# Patient Record
Sex: Female | Born: 1940
Health system: Southern US, Community
[De-identification: ages and names within clinical notes are randomized; demographics above are authoritative.]

## PROBLEM LIST (undated history)

## (undated) DIAGNOSIS — E7800 Pure hypercholesterolemia, unspecified: Secondary | ICD-10-CM

## (undated) DIAGNOSIS — F0390 Unspecified dementia without behavioral disturbance: Secondary | ICD-10-CM

## (undated) DIAGNOSIS — I639 Cerebral infarction, unspecified: Secondary | ICD-10-CM

## (undated) DIAGNOSIS — E119 Type 2 diabetes mellitus without complications: Secondary | ICD-10-CM

## (undated) DIAGNOSIS — I10 Essential (primary) hypertension: Secondary | ICD-10-CM

## (undated) DIAGNOSIS — H5440 Blindness, one eye, unspecified eye: Secondary | ICD-10-CM

## (undated) HISTORY — PX: ABDOMINAL HYSTERECTOMY: SHX81

## (undated) HISTORY — PX: COLON SURGERY: SHX602

## (undated) HISTORY — PX: CHOLECYSTECTOMY: SHX55

## (undated) HISTORY — PX: APPENDECTOMY: SHX54

---

## 2014-02-07 DIAGNOSIS — Z8679 Personal history of other diseases of the circulatory system: Secondary | ICD-10-CM | POA: Diagnosis not present

## 2014-02-07 DIAGNOSIS — R413 Other amnesia: Secondary | ICD-10-CM | POA: Diagnosis not present

## 2014-02-07 DIAGNOSIS — E119 Type 2 diabetes mellitus without complications: Secondary | ICD-10-CM | POA: Diagnosis not present

## 2014-02-07 DIAGNOSIS — R32 Unspecified urinary incontinence: Secondary | ICD-10-CM | POA: Diagnosis not present

## 2014-02-07 DIAGNOSIS — I10 Essential (primary) hypertension: Secondary | ICD-10-CM | POA: Diagnosis not present

## 2014-06-19 DIAGNOSIS — R413 Other amnesia: Secondary | ICD-10-CM | POA: Diagnosis not present

## 2014-06-19 DIAGNOSIS — I6350 Cerebral infarction due to unspecified occlusion or stenosis of unspecified cerebral artery: Secondary | ICD-10-CM | POA: Diagnosis not present

## 2014-09-20 DIAGNOSIS — Z79899 Other long term (current) drug therapy: Secondary | ICD-10-CM | POA: Diagnosis not present

## 2014-09-20 DIAGNOSIS — H269 Unspecified cataract: Secondary | ICD-10-CM | POA: Diagnosis not present

## 2014-09-20 DIAGNOSIS — E119 Type 2 diabetes mellitus without complications: Secondary | ICD-10-CM | POA: Diagnosis not present

## 2014-09-20 DIAGNOSIS — Z23 Encounter for immunization: Secondary | ICD-10-CM | POA: Diagnosis not present

## 2014-09-20 DIAGNOSIS — R32 Unspecified urinary incontinence: Secondary | ICD-10-CM | POA: Diagnosis not present

## 2014-09-20 DIAGNOSIS — I10 Essential (primary) hypertension: Secondary | ICD-10-CM | POA: Diagnosis not present

## 2014-09-20 DIAGNOSIS — B079 Viral wart, unspecified: Secondary | ICD-10-CM | POA: Diagnosis not present

## 2014-09-20 DIAGNOSIS — E559 Vitamin D deficiency, unspecified: Secondary | ICD-10-CM | POA: Diagnosis not present

## 2014-09-20 DIAGNOSIS — E785 Hyperlipidemia, unspecified: Secondary | ICD-10-CM | POA: Diagnosis not present

## 2015-02-21 DIAGNOSIS — I10 Essential (primary) hypertension: Secondary | ICD-10-CM | POA: Diagnosis not present

## 2015-02-21 DIAGNOSIS — M1990 Unspecified osteoarthritis, unspecified site: Secondary | ICD-10-CM | POA: Diagnosis not present

## 2015-02-21 DIAGNOSIS — R413 Other amnesia: Secondary | ICD-10-CM | POA: Diagnosis not present

## 2015-02-21 DIAGNOSIS — E119 Type 2 diabetes mellitus without complications: Secondary | ICD-10-CM | POA: Diagnosis not present

## 2015-05-02 DIAGNOSIS — R413 Other amnesia: Secondary | ICD-10-CM | POA: Diagnosis not present

## 2015-06-02 DIAGNOSIS — I10 Essential (primary) hypertension: Secondary | ICD-10-CM | POA: Diagnosis not present

## 2015-06-02 DIAGNOSIS — Z8673 Personal history of transient ischemic attack (TIA), and cerebral infarction without residual deficits: Secondary | ICD-10-CM | POA: Diagnosis not present

## 2015-06-02 DIAGNOSIS — E119 Type 2 diabetes mellitus without complications: Secondary | ICD-10-CM | POA: Diagnosis not present

## 2015-06-02 DIAGNOSIS — H6122 Impacted cerumen, left ear: Secondary | ICD-10-CM | POA: Diagnosis not present

## 2015-09-05 DIAGNOSIS — E0800 Diabetes mellitus due to underlying condition with hyperosmolarity without nonketotic hyperglycemic-hyperosmolar coma (NKHHC): Secondary | ICD-10-CM | POA: Diagnosis not present

## 2015-09-05 DIAGNOSIS — E782 Mixed hyperlipidemia: Secondary | ICD-10-CM | POA: Diagnosis not present

## 2015-09-05 DIAGNOSIS — I10 Essential (primary) hypertension: Secondary | ICD-10-CM | POA: Diagnosis not present

## 2015-09-05 DIAGNOSIS — G309 Alzheimer's disease, unspecified: Secondary | ICD-10-CM | POA: Diagnosis not present

## 2015-09-18 DIAGNOSIS — Z1231 Encounter for screening mammogram for malignant neoplasm of breast: Secondary | ICD-10-CM | POA: Diagnosis not present

## 2015-10-03 DIAGNOSIS — Z23 Encounter for immunization: Secondary | ICD-10-CM | POA: Diagnosis not present

## 2015-10-03 DIAGNOSIS — E782 Mixed hyperlipidemia: Secondary | ICD-10-CM | POA: Diagnosis not present

## 2015-10-03 DIAGNOSIS — E0800 Diabetes mellitus due to underlying condition with hyperosmolarity without nonketotic hyperglycemic-hyperosmolar coma (NKHHC): Secondary | ICD-10-CM | POA: Diagnosis not present

## 2015-10-03 DIAGNOSIS — I10 Essential (primary) hypertension: Secondary | ICD-10-CM | POA: Diagnosis not present

## 2016-02-06 DIAGNOSIS — E782 Mixed hyperlipidemia: Secondary | ICD-10-CM | POA: Diagnosis not present

## 2016-02-06 DIAGNOSIS — G309 Alzheimer's disease, unspecified: Secondary | ICD-10-CM | POA: Diagnosis not present

## 2016-02-06 DIAGNOSIS — E089 Diabetes mellitus due to underlying condition without complications: Secondary | ICD-10-CM | POA: Diagnosis not present

## 2016-02-06 DIAGNOSIS — I10 Essential (primary) hypertension: Secondary | ICD-10-CM | POA: Diagnosis not present

## 2016-03-16 DIAGNOSIS — M810 Age-related osteoporosis without current pathological fracture: Secondary | ICD-10-CM | POA: Diagnosis not present

## 2016-03-16 DIAGNOSIS — M8589 Other specified disorders of bone density and structure, multiple sites: Secondary | ICD-10-CM | POA: Diagnosis not present

## 2016-07-16 DIAGNOSIS — E089 Diabetes mellitus due to underlying condition without complications: Secondary | ICD-10-CM | POA: Diagnosis not present

## 2016-07-16 DIAGNOSIS — I10 Essential (primary) hypertension: Secondary | ICD-10-CM | POA: Diagnosis not present

## 2016-07-16 DIAGNOSIS — G309 Alzheimer's disease, unspecified: Secondary | ICD-10-CM | POA: Diagnosis not present

## 2016-07-16 DIAGNOSIS — E782 Mixed hyperlipidemia: Secondary | ICD-10-CM | POA: Diagnosis not present

## 2016-07-30 DIAGNOSIS — E119 Type 2 diabetes mellitus without complications: Secondary | ICD-10-CM | POA: Diagnosis not present

## 2016-07-30 DIAGNOSIS — H348112 Central retinal vein occlusion, right eye, stable: Secondary | ICD-10-CM | POA: Diagnosis not present

## 2016-07-30 DIAGNOSIS — H2513 Age-related nuclear cataract, bilateral: Secondary | ICD-10-CM | POA: Diagnosis not present

## 2016-07-30 DIAGNOSIS — H40013 Open angle with borderline findings, low risk, bilateral: Secondary | ICD-10-CM | POA: Diagnosis not present

## 2016-07-30 DIAGNOSIS — H43392 Other vitreous opacities, left eye: Secondary | ICD-10-CM | POA: Diagnosis not present

## 2016-08-04 ENCOUNTER — Encounter (INDEPENDENT_AMBULATORY_CARE_PROVIDER_SITE_OTHER): Payer: Self-pay | Admitting: Ophthalmology

## 2016-08-18 ENCOUNTER — Encounter (INDEPENDENT_AMBULATORY_CARE_PROVIDER_SITE_OTHER): Payer: Medicare Other | Admitting: Ophthalmology

## 2016-08-18 DIAGNOSIS — H2513 Age-related nuclear cataract, bilateral: Secondary | ICD-10-CM | POA: Diagnosis not present

## 2016-08-18 DIAGNOSIS — H34811 Central retinal vein occlusion, right eye, with macular edema: Secondary | ICD-10-CM

## 2016-08-18 DIAGNOSIS — H43813 Vitreous degeneration, bilateral: Secondary | ICD-10-CM | POA: Diagnosis not present

## 2016-08-18 DIAGNOSIS — H35033 Hypertensive retinopathy, bilateral: Secondary | ICD-10-CM | POA: Diagnosis not present

## 2016-08-18 DIAGNOSIS — I10 Essential (primary) hypertension: Secondary | ICD-10-CM | POA: Diagnosis not present

## 2016-10-23 DIAGNOSIS — G309 Alzheimer's disease, unspecified: Secondary | ICD-10-CM | POA: Diagnosis not present

## 2016-10-23 DIAGNOSIS — Z23 Encounter for immunization: Secondary | ICD-10-CM | POA: Diagnosis not present

## 2016-10-23 DIAGNOSIS — I10 Essential (primary) hypertension: Secondary | ICD-10-CM | POA: Diagnosis not present

## 2016-10-23 DIAGNOSIS — E782 Mixed hyperlipidemia: Secondary | ICD-10-CM | POA: Diagnosis not present

## 2016-10-27 DIAGNOSIS — Z9181 History of falling: Secondary | ICD-10-CM | POA: Diagnosis not present

## 2016-10-27 DIAGNOSIS — R261 Paralytic gait: Secondary | ICD-10-CM | POA: Diagnosis not present

## 2016-10-27 DIAGNOSIS — F0390 Unspecified dementia without behavioral disturbance: Secondary | ICD-10-CM | POA: Diagnosis not present

## 2016-10-27 DIAGNOSIS — I10 Essential (primary) hypertension: Secondary | ICD-10-CM | POA: Diagnosis not present

## 2016-10-27 DIAGNOSIS — F329 Major depressive disorder, single episode, unspecified: Secondary | ICD-10-CM | POA: Diagnosis not present

## 2016-10-27 DIAGNOSIS — E119 Type 2 diabetes mellitus without complications: Secondary | ICD-10-CM | POA: Diagnosis not present

## 2016-10-28 DIAGNOSIS — F0390 Unspecified dementia without behavioral disturbance: Secondary | ICD-10-CM | POA: Diagnosis not present

## 2016-10-28 DIAGNOSIS — F329 Major depressive disorder, single episode, unspecified: Secondary | ICD-10-CM | POA: Diagnosis not present

## 2016-10-28 DIAGNOSIS — E119 Type 2 diabetes mellitus without complications: Secondary | ICD-10-CM | POA: Diagnosis not present

## 2016-10-28 DIAGNOSIS — Z9181 History of falling: Secondary | ICD-10-CM | POA: Diagnosis not present

## 2016-10-28 DIAGNOSIS — R261 Paralytic gait: Secondary | ICD-10-CM | POA: Diagnosis not present

## 2016-10-28 DIAGNOSIS — I10 Essential (primary) hypertension: Secondary | ICD-10-CM | POA: Diagnosis not present

## 2016-11-02 DIAGNOSIS — F0390 Unspecified dementia without behavioral disturbance: Secondary | ICD-10-CM | POA: Diagnosis not present

## 2016-11-02 DIAGNOSIS — F329 Major depressive disorder, single episode, unspecified: Secondary | ICD-10-CM | POA: Diagnosis not present

## 2016-11-02 DIAGNOSIS — E119 Type 2 diabetes mellitus without complications: Secondary | ICD-10-CM | POA: Diagnosis not present

## 2016-11-02 DIAGNOSIS — Z9181 History of falling: Secondary | ICD-10-CM | POA: Diagnosis not present

## 2016-11-02 DIAGNOSIS — R261 Paralytic gait: Secondary | ICD-10-CM | POA: Diagnosis not present

## 2016-11-02 DIAGNOSIS — I10 Essential (primary) hypertension: Secondary | ICD-10-CM | POA: Diagnosis not present

## 2016-11-03 DIAGNOSIS — E119 Type 2 diabetes mellitus without complications: Secondary | ICD-10-CM | POA: Diagnosis not present

## 2016-11-03 DIAGNOSIS — I10 Essential (primary) hypertension: Secondary | ICD-10-CM | POA: Diagnosis not present

## 2016-11-03 DIAGNOSIS — Z9181 History of falling: Secondary | ICD-10-CM | POA: Diagnosis not present

## 2016-11-03 DIAGNOSIS — F0390 Unspecified dementia without behavioral disturbance: Secondary | ICD-10-CM | POA: Diagnosis not present

## 2016-11-03 DIAGNOSIS — F329 Major depressive disorder, single episode, unspecified: Secondary | ICD-10-CM | POA: Diagnosis not present

## 2016-11-03 DIAGNOSIS — R261 Paralytic gait: Secondary | ICD-10-CM | POA: Diagnosis not present

## 2016-11-05 DIAGNOSIS — R261 Paralytic gait: Secondary | ICD-10-CM | POA: Diagnosis not present

## 2016-11-05 DIAGNOSIS — F0390 Unspecified dementia without behavioral disturbance: Secondary | ICD-10-CM | POA: Diagnosis not present

## 2016-11-05 DIAGNOSIS — Z9181 History of falling: Secondary | ICD-10-CM | POA: Diagnosis not present

## 2016-11-05 DIAGNOSIS — F329 Major depressive disorder, single episode, unspecified: Secondary | ICD-10-CM | POA: Diagnosis not present

## 2016-11-05 DIAGNOSIS — I10 Essential (primary) hypertension: Secondary | ICD-10-CM | POA: Diagnosis not present

## 2016-11-05 DIAGNOSIS — E119 Type 2 diabetes mellitus without complications: Secondary | ICD-10-CM | POA: Diagnosis not present

## 2016-11-09 DIAGNOSIS — I10 Essential (primary) hypertension: Secondary | ICD-10-CM | POA: Diagnosis not present

## 2016-11-09 DIAGNOSIS — R261 Paralytic gait: Secondary | ICD-10-CM | POA: Diagnosis not present

## 2016-11-09 DIAGNOSIS — E119 Type 2 diabetes mellitus without complications: Secondary | ICD-10-CM | POA: Diagnosis not present

## 2016-11-09 DIAGNOSIS — Z9181 History of falling: Secondary | ICD-10-CM | POA: Diagnosis not present

## 2016-11-09 DIAGNOSIS — F0390 Unspecified dementia without behavioral disturbance: Secondary | ICD-10-CM | POA: Diagnosis not present

## 2016-11-09 DIAGNOSIS — F329 Major depressive disorder, single episode, unspecified: Secondary | ICD-10-CM | POA: Diagnosis not present

## 2016-11-10 DIAGNOSIS — F0390 Unspecified dementia without behavioral disturbance: Secondary | ICD-10-CM | POA: Diagnosis not present

## 2016-11-10 DIAGNOSIS — F329 Major depressive disorder, single episode, unspecified: Secondary | ICD-10-CM | POA: Diagnosis not present

## 2016-11-10 DIAGNOSIS — E119 Type 2 diabetes mellitus without complications: Secondary | ICD-10-CM | POA: Diagnosis not present

## 2016-11-10 DIAGNOSIS — R261 Paralytic gait: Secondary | ICD-10-CM | POA: Diagnosis not present

## 2016-11-10 DIAGNOSIS — I10 Essential (primary) hypertension: Secondary | ICD-10-CM | POA: Diagnosis not present

## 2016-11-10 DIAGNOSIS — Z9181 History of falling: Secondary | ICD-10-CM | POA: Diagnosis not present

## 2016-11-11 DIAGNOSIS — R261 Paralytic gait: Secondary | ICD-10-CM | POA: Diagnosis not present

## 2016-11-11 DIAGNOSIS — I10 Essential (primary) hypertension: Secondary | ICD-10-CM | POA: Diagnosis not present

## 2016-11-11 DIAGNOSIS — E119 Type 2 diabetes mellitus without complications: Secondary | ICD-10-CM | POA: Diagnosis not present

## 2016-11-11 DIAGNOSIS — F0390 Unspecified dementia without behavioral disturbance: Secondary | ICD-10-CM | POA: Diagnosis not present

## 2016-11-11 DIAGNOSIS — F329 Major depressive disorder, single episode, unspecified: Secondary | ICD-10-CM | POA: Diagnosis not present

## 2016-11-11 DIAGNOSIS — Z9181 History of falling: Secondary | ICD-10-CM | POA: Diagnosis not present

## 2016-11-12 DIAGNOSIS — I10 Essential (primary) hypertension: Secondary | ICD-10-CM | POA: Diagnosis not present

## 2016-11-12 DIAGNOSIS — Z9181 History of falling: Secondary | ICD-10-CM | POA: Diagnosis not present

## 2016-11-12 DIAGNOSIS — F0390 Unspecified dementia without behavioral disturbance: Secondary | ICD-10-CM | POA: Diagnosis not present

## 2016-11-12 DIAGNOSIS — R261 Paralytic gait: Secondary | ICD-10-CM | POA: Diagnosis not present

## 2016-11-12 DIAGNOSIS — E119 Type 2 diabetes mellitus without complications: Secondary | ICD-10-CM | POA: Diagnosis not present

## 2016-11-12 DIAGNOSIS — F329 Major depressive disorder, single episode, unspecified: Secondary | ICD-10-CM | POA: Diagnosis not present

## 2016-11-15 DIAGNOSIS — R261 Paralytic gait: Secondary | ICD-10-CM | POA: Diagnosis not present

## 2016-11-15 DIAGNOSIS — E119 Type 2 diabetes mellitus without complications: Secondary | ICD-10-CM | POA: Diagnosis not present

## 2016-11-15 DIAGNOSIS — Z9181 History of falling: Secondary | ICD-10-CM | POA: Diagnosis not present

## 2016-11-15 DIAGNOSIS — F0390 Unspecified dementia without behavioral disturbance: Secondary | ICD-10-CM | POA: Diagnosis not present

## 2016-11-15 DIAGNOSIS — I10 Essential (primary) hypertension: Secondary | ICD-10-CM | POA: Diagnosis not present

## 2016-11-15 DIAGNOSIS — F329 Major depressive disorder, single episode, unspecified: Secondary | ICD-10-CM | POA: Diagnosis not present

## 2016-11-16 DIAGNOSIS — I10 Essential (primary) hypertension: Secondary | ICD-10-CM | POA: Diagnosis not present

## 2016-11-16 DIAGNOSIS — R261 Paralytic gait: Secondary | ICD-10-CM | POA: Diagnosis not present

## 2016-11-16 DIAGNOSIS — F0390 Unspecified dementia without behavioral disturbance: Secondary | ICD-10-CM | POA: Diagnosis not present

## 2016-11-16 DIAGNOSIS — F329 Major depressive disorder, single episode, unspecified: Secondary | ICD-10-CM | POA: Diagnosis not present

## 2016-11-16 DIAGNOSIS — E119 Type 2 diabetes mellitus without complications: Secondary | ICD-10-CM | POA: Diagnosis not present

## 2016-11-16 DIAGNOSIS — Z9181 History of falling: Secondary | ICD-10-CM | POA: Diagnosis not present

## 2016-11-19 DIAGNOSIS — E119 Type 2 diabetes mellitus without complications: Secondary | ICD-10-CM | POA: Diagnosis not present

## 2016-11-19 DIAGNOSIS — F0390 Unspecified dementia without behavioral disturbance: Secondary | ICD-10-CM | POA: Diagnosis not present

## 2016-11-19 DIAGNOSIS — R261 Paralytic gait: Secondary | ICD-10-CM | POA: Diagnosis not present

## 2016-11-19 DIAGNOSIS — I10 Essential (primary) hypertension: Secondary | ICD-10-CM | POA: Diagnosis not present

## 2016-11-19 DIAGNOSIS — Z9181 History of falling: Secondary | ICD-10-CM | POA: Diagnosis not present

## 2016-11-19 DIAGNOSIS — F329 Major depressive disorder, single episode, unspecified: Secondary | ICD-10-CM | POA: Diagnosis not present

## 2016-11-24 DIAGNOSIS — E119 Type 2 diabetes mellitus without complications: Secondary | ICD-10-CM | POA: Diagnosis not present

## 2016-11-24 DIAGNOSIS — F0390 Unspecified dementia without behavioral disturbance: Secondary | ICD-10-CM | POA: Diagnosis not present

## 2016-11-24 DIAGNOSIS — I10 Essential (primary) hypertension: Secondary | ICD-10-CM | POA: Diagnosis not present

## 2016-11-24 DIAGNOSIS — Z9181 History of falling: Secondary | ICD-10-CM | POA: Diagnosis not present

## 2016-11-24 DIAGNOSIS — F329 Major depressive disorder, single episode, unspecified: Secondary | ICD-10-CM | POA: Diagnosis not present

## 2016-11-24 DIAGNOSIS — R261 Paralytic gait: Secondary | ICD-10-CM | POA: Diagnosis not present

## 2016-11-25 DIAGNOSIS — Z9181 History of falling: Secondary | ICD-10-CM | POA: Diagnosis not present

## 2016-11-25 DIAGNOSIS — F329 Major depressive disorder, single episode, unspecified: Secondary | ICD-10-CM | POA: Diagnosis not present

## 2016-11-25 DIAGNOSIS — R261 Paralytic gait: Secondary | ICD-10-CM | POA: Diagnosis not present

## 2016-11-25 DIAGNOSIS — E119 Type 2 diabetes mellitus without complications: Secondary | ICD-10-CM | POA: Diagnosis not present

## 2016-11-25 DIAGNOSIS — I10 Essential (primary) hypertension: Secondary | ICD-10-CM | POA: Diagnosis not present

## 2016-11-25 DIAGNOSIS — F0390 Unspecified dementia without behavioral disturbance: Secondary | ICD-10-CM | POA: Diagnosis not present

## 2016-11-26 DIAGNOSIS — Z9181 History of falling: Secondary | ICD-10-CM | POA: Diagnosis not present

## 2016-11-26 DIAGNOSIS — F329 Major depressive disorder, single episode, unspecified: Secondary | ICD-10-CM | POA: Diagnosis not present

## 2016-11-26 DIAGNOSIS — R261 Paralytic gait: Secondary | ICD-10-CM | POA: Diagnosis not present

## 2016-11-26 DIAGNOSIS — F0390 Unspecified dementia without behavioral disturbance: Secondary | ICD-10-CM | POA: Diagnosis not present

## 2016-11-26 DIAGNOSIS — E119 Type 2 diabetes mellitus without complications: Secondary | ICD-10-CM | POA: Diagnosis not present

## 2016-11-26 DIAGNOSIS — I10 Essential (primary) hypertension: Secondary | ICD-10-CM | POA: Diagnosis not present

## 2016-11-29 DIAGNOSIS — Z9181 History of falling: Secondary | ICD-10-CM | POA: Diagnosis not present

## 2016-11-29 DIAGNOSIS — E119 Type 2 diabetes mellitus without complications: Secondary | ICD-10-CM | POA: Diagnosis not present

## 2016-11-29 DIAGNOSIS — F0390 Unspecified dementia without behavioral disturbance: Secondary | ICD-10-CM | POA: Diagnosis not present

## 2016-11-29 DIAGNOSIS — R261 Paralytic gait: Secondary | ICD-10-CM | POA: Diagnosis not present

## 2016-11-29 DIAGNOSIS — I10 Essential (primary) hypertension: Secondary | ICD-10-CM | POA: Diagnosis not present

## 2016-11-29 DIAGNOSIS — F329 Major depressive disorder, single episode, unspecified: Secondary | ICD-10-CM | POA: Diagnosis not present

## 2016-11-30 DIAGNOSIS — Z9181 History of falling: Secondary | ICD-10-CM | POA: Diagnosis not present

## 2016-11-30 DIAGNOSIS — I10 Essential (primary) hypertension: Secondary | ICD-10-CM | POA: Diagnosis not present

## 2016-11-30 DIAGNOSIS — F0390 Unspecified dementia without behavioral disturbance: Secondary | ICD-10-CM | POA: Diagnosis not present

## 2016-11-30 DIAGNOSIS — F329 Major depressive disorder, single episode, unspecified: Secondary | ICD-10-CM | POA: Diagnosis not present

## 2016-11-30 DIAGNOSIS — E119 Type 2 diabetes mellitus without complications: Secondary | ICD-10-CM | POA: Diagnosis not present

## 2016-11-30 DIAGNOSIS — R261 Paralytic gait: Secondary | ICD-10-CM | POA: Diagnosis not present

## 2016-12-13 ENCOUNTER — Ambulatory Visit (INDEPENDENT_AMBULATORY_CARE_PROVIDER_SITE_OTHER): Payer: Medicare Other | Admitting: Ophthalmology

## 2017-02-26 DIAGNOSIS — I10 Essential (primary) hypertension: Secondary | ICD-10-CM | POA: Diagnosis not present

## 2017-02-26 DIAGNOSIS — E782 Mixed hyperlipidemia: Secondary | ICD-10-CM | POA: Diagnosis not present

## 2017-02-26 DIAGNOSIS — G309 Alzheimer's disease, unspecified: Secondary | ICD-10-CM | POA: Diagnosis not present

## 2017-02-26 DIAGNOSIS — E0800 Diabetes mellitus due to underlying condition with hyperosmolarity without nonketotic hyperglycemic-hyperosmolar coma (NKHHC): Secondary | ICD-10-CM | POA: Diagnosis not present

## 2017-08-20 DIAGNOSIS — E782 Mixed hyperlipidemia: Secondary | ICD-10-CM | POA: Diagnosis not present

## 2017-08-20 DIAGNOSIS — I10 Essential (primary) hypertension: Secondary | ICD-10-CM | POA: Diagnosis not present

## 2017-08-20 DIAGNOSIS — E0800 Diabetes mellitus due to underlying condition with hyperosmolarity without nonketotic hyperglycemic-hyperosmolar coma (NKHHC): Secondary | ICD-10-CM | POA: Diagnosis not present

## 2017-08-20 DIAGNOSIS — H5410 Blindness, one eye, low vision other eye, unspecified eyes: Secondary | ICD-10-CM | POA: Diagnosis not present

## 2017-08-20 DIAGNOSIS — G309 Alzheimer's disease, unspecified: Secondary | ICD-10-CM | POA: Diagnosis not present

## 2018-01-21 DIAGNOSIS — E782 Mixed hyperlipidemia: Secondary | ICD-10-CM | POA: Diagnosis not present

## 2018-01-21 DIAGNOSIS — H5410 Blindness, one eye, low vision other eye, unspecified eyes: Secondary | ICD-10-CM | POA: Diagnosis not present

## 2018-01-21 DIAGNOSIS — I10 Essential (primary) hypertension: Secondary | ICD-10-CM | POA: Diagnosis not present

## 2018-01-21 DIAGNOSIS — E0800 Diabetes mellitus due to underlying condition with hyperosmolarity without nonketotic hyperglycemic-hyperosmolar coma (NKHHC): Secondary | ICD-10-CM | POA: Diagnosis not present

## 2018-01-21 DIAGNOSIS — G309 Alzheimer's disease, unspecified: Secondary | ICD-10-CM | POA: Diagnosis not present

## 2018-04-06 ENCOUNTER — Other Ambulatory Visit: Payer: Self-pay

## 2018-04-06 ENCOUNTER — Emergency Department (HOSPITAL_COMMUNITY)
Admission: EM | Admit: 2018-04-06 | Discharge: 2018-04-06 | Disposition: A | Discharge status: Home / Self Care | Payer: Medicare Other | Attending: Emergency Medicine | Admitting: Emergency Medicine

## 2018-04-06 ENCOUNTER — Encounter (HOSPITAL_COMMUNITY): Payer: Self-pay | Admitting: Emergency Medicine

## 2018-04-06 ENCOUNTER — Emergency Department (HOSPITAL_COMMUNITY): Payer: Medicare Other

## 2018-04-06 DIAGNOSIS — Z7984 Long term (current) use of oral hypoglycemic drugs: Secondary | ICD-10-CM | POA: Insufficient documentation

## 2018-04-06 DIAGNOSIS — Y92009 Unspecified place in unspecified non-institutional (private) residence as the place of occurrence of the external cause: Secondary | ICD-10-CM | POA: Diagnosis not present

## 2018-04-06 DIAGNOSIS — F0390 Unspecified dementia without behavioral disturbance: Secondary | ICD-10-CM | POA: Diagnosis not present

## 2018-04-06 DIAGNOSIS — W01198A Fall on same level from slipping, tripping and stumbling with subsequent striking against other object, initial encounter: Secondary | ICD-10-CM | POA: Insufficient documentation

## 2018-04-06 DIAGNOSIS — E119 Type 2 diabetes mellitus without complications: Secondary | ICD-10-CM | POA: Insufficient documentation

## 2018-04-06 DIAGNOSIS — I10 Essential (primary) hypertension: Secondary | ICD-10-CM | POA: Diagnosis not present

## 2018-04-06 DIAGNOSIS — W19XXXA Unspecified fall, initial encounter: Secondary | ICD-10-CM

## 2018-04-06 DIAGNOSIS — S0101XA Laceration without foreign body of scalp, initial encounter: Secondary | ICD-10-CM | POA: Insufficient documentation

## 2018-04-06 DIAGNOSIS — Y93E8 Activity, other personal hygiene: Secondary | ICD-10-CM | POA: Diagnosis not present

## 2018-04-06 DIAGNOSIS — Z79899 Other long term (current) drug therapy: Secondary | ICD-10-CM | POA: Diagnosis not present

## 2018-04-06 DIAGNOSIS — S199XXA Unspecified injury of neck, initial encounter: Secondary | ICD-10-CM | POA: Diagnosis not present

## 2018-04-06 DIAGNOSIS — Z7982 Long term (current) use of aspirin: Secondary | ICD-10-CM | POA: Insufficient documentation

## 2018-04-06 DIAGNOSIS — S0990XA Unspecified injury of head, initial encounter: Secondary | ICD-10-CM | POA: Diagnosis not present

## 2018-04-06 DIAGNOSIS — S0003XA Contusion of scalp, initial encounter: Secondary | ICD-10-CM | POA: Diagnosis not present

## 2018-04-06 DIAGNOSIS — S064X0A Epidural hemorrhage without loss of consciousness, initial encounter: Secondary | ICD-10-CM | POA: Diagnosis not present

## 2018-04-06 DIAGNOSIS — Y999 Unspecified external cause status: Secondary | ICD-10-CM | POA: Diagnosis not present

## 2018-04-06 HISTORY — DX: Pure hypercholesterolemia, unspecified: E78.00

## 2018-04-06 HISTORY — DX: Blindness, one eye, unspecified eye: H54.40

## 2018-04-06 HISTORY — DX: Cerebral infarction, unspecified: I63.9

## 2018-04-06 HISTORY — DX: Essential (primary) hypertension: I10

## 2018-04-06 HISTORY — DX: Type 2 diabetes mellitus without complications: E11.9

## 2018-04-06 HISTORY — DX: Unspecified dementia, unspecified severity, without behavioral disturbance, psychotic disturbance, mood disturbance, and anxiety: F03.90

## 2018-04-06 MED ORDER — LIDOCAINE-EPINEPHRINE 2 %-1:200000 IJ SOLN
10.0000 mL | Freq: Once | INTRAMUSCULAR | Status: AC
Start: 2018-04-06 — End: 2018-04-06
  Administered 2018-04-06: 10 mL
  Filled 2018-04-06: qty 20

## 2018-04-06 NOTE — Care Management Note (Signed)
Case Management Note  CM was contacted by Denyse Amassorey with [facility] reporting pt was active with their PCS services and would be happy to start [facility].  Spoke with Dr. Fayrene FearingJames who agreed with plan.  Dr. Fayrene FearingJames did not remember to place orders within the allotted amount of time after pt wa D/C.  Updated Denyse AmassCorey that he will need to contact pt's PCP to start [facility] PT.  No further CM needs noted at this time.

## 2018-04-06 NOTE — ED Triage Notes (Signed)
Pt arrives via EMS. Per her daughter pt fell backwards from standing when trying to get off of the bedside commode. She hit her head on the floor, no LOC.

## 2018-04-06 NOTE — Discharge Instructions (Addendum)
Staple removal in 7-10 days. °

## 2018-04-06 NOTE — ED Notes (Signed)
Bed: WA21 Expected date:  Expected time:  Means of arrival:  Comments: 77 yo fall

## 2018-04-06 NOTE — ED Provider Notes (Signed)
[facility]-EMERGENCY DEPT Provider Note   CSN: [id number] Arrival date & time: 04/06/18  0813     History   Chief Complaint Chief Complaint  Patient presents with  . Fall    HPI Sydney Horton is a 77 y.o. female.  Chief complaint is fall  HPI 77 year old female.  Accompanied by her daughter.  Patient lives at home with her daughter.  History of dementia.  She was standing from her bedside commode this morning.  This was with assist.  However, she lost her balance and fell backwards and struck her head against a piece of furniture and has a laceration to the left occiput.  Daughter she was not unconscious.  She is mentating at her baseline.  Ambulatory.  No difficulty with use of arms or legs or level of consciousness.  Coagulated only with a baby aspirin daily  Past Medical History:  Diagnosis Date  . Blind right eye   . Dementia   . Diabetes mellitus without complication (HCC)   . High cholesterol   . Hypertension   . Stroke [facility])     There are no active problems to display for this patient.   Past Surgical History:  Procedure Laterality Date  . ABDOMINAL HYSTERECTOMY    . APPENDECTOMY    . CHOLECYSTECTOMY    . COLON SURGERY       OB History   None      Home Medications    Prior to Admission medications   Medication Sig Start Date End Date Taking? Authorizing Provider  aspirin EC 81 MG tablet Take 81 mg by mouth daily.   Yes [provider]  benazepril (LOTENSIN) 10 MG tablet Take 10 mg by mouth daily. 03/23/18  Yes [provider]  memantine (NAMENDA XR) 28 MG CP24 24 hr capsule Take 28 mg by mouth daily. 03/19/18  Yes [provider]  metFORMIN (GLUCOPHAGE) 1000 MG tablet Take 1,000 mg by mouth 2 (two) times daily. 03/19/18  Yes [provider]  MYRBETRIQ 25 MG TB24 tablet Take 25 mg by mouth daily. 03/20/18  Yes [provider]  rivastigmine (EXELON) 13.3 MG/24HR Place 1 patch onto the  skin daily. 03/19/18  Yes [provider]  sertraline (ZOLOFT) 25 MG tablet Take 25 mg by mouth daily. 03/23/18  Yes [provider]    Family History No family history on file.  Social History Social History   Tobacco Use  . Smoking status: Never Smoker  . Smokeless tobacco: Never Used  Substance Use Topics  . Alcohol use: Never    Frequency: Never  . Drug use: Never     Allergies   Patient has no known allergies.   Review of Systems Review of Systems  Constitutional: Negative for appetite change, chills, diaphoresis, fatigue and fever.  HENT: Negative for mouth sores, sore throat and trouble swallowing.        Scalp laceration  Eyes: Negative for visual disturbance.  Respiratory: Negative for cough, chest tightness, shortness of breath and wheezing.   Cardiovascular: Negative for chest pain.  Gastrointestinal: Negative for abdominal distention, abdominal pain, diarrhea, nausea and vomiting.  Endocrine: Negative for polydipsia, polyphagia and polyuria.  Genitourinary: Negative for dysuria, frequency and hematuria.  Musculoskeletal: Negative for gait problem.  Skin: Positive for wound. Negative for color change, pallor and rash.  Neurological: Negative for dizziness, syncope, light-headedness and headaches.  Hematological: Does not bruise/bleed easily.  Psychiatric/Behavioral: Negative for behavioral problems and confusion.  Physical Exam Updated Vital Signs BP (!) 164/91   Pulse 90   Temp 99 F (37.2 C) (Oral)   Resp 18   Ht 5' (1.524 m)   Wt 61.2 kg (135 lb)   SpO2 96%   BMI 26.37 kg/m   Physical Exam  Constitutional: She appears well-developed and well-nourished. No distress.  HENT:  Head: Normocephalic.  3 cm horizontally oriented occipital scalp laceration just to the left of midline.  No blood over TMs, mastoids, or from ears nose or mouth.  She is in a cervical collar.  Denies any neck pain.  Eyes: Pupils are equal, round, and  reactive to light. Conjunctivae are normal. No scleral icterus.  Neck: Normal range of motion. Neck supple. No thyromegaly present.  Cardiovascular: Normal rate and regular rhythm. Exam reveals no gallop and no friction rub.  No murmur heard. Pulmonary/Chest: Effort normal and breath sounds normal. No respiratory distress. She has no wheezes. She has no rales.  Abdominal: Soft. Bowel sounds are normal. She exhibits no distension. There is no tenderness. There is no rebound.  Musculoskeletal: Normal range of motion.  Neurological: She is alert.  Awake.  Smiling and interactive.  Dementia evident.  No focal neurological deficits.  Skin: Skin is warm and dry. No rash noted.  Psychiatric: She has a normal mood and affect. Her behavior is normal.     LACERATION REPAIR Performed by: Claudean KindsMark Joseph Kess Mcilwain Authorized by: Claudean KindsMark Joseph Osha Errico Consent: Verbal consent obtained. Risks and benefits: risks, benefits and alternatives were discussed Consent given by: patient Patient identity confirmed: provided demographic data Prepped and Draped in normal sterile fashion Wound explored  Laceration Location: Lt occiput  Laceration Length: 3cm  No Foreign Bodies seen or palpated  Anesthesia: local infiltration  Local anesthetic: lidocaine 1% c epinephrine  Anesthetic total: 4 ml  Irrigation method: syringe Amount of cleaning: standard  Skin closure: 3 staples  Number of sutures: 3 staples  Technique: staples  Patient tolerance: Patient tolerated the procedure well with no immediate complications.   Patient CT of her neck shows no acute findings or fracture.  CT of head shows thickening of the posterior falx and right tentorium.  Differential diagnosis includes simple dural thickening versus trauma.  This was called to me by radiologist.  I have reviewed this myself.  This is a subtle finding.  I discussed the case with Dr. Lovell SheehanJenkins of neurosurgery.  He felt that this was not clinically  significant.  We discussed the patient's baseline health status.  He felt that if they were this were a large subdural he would not surgically intervene due to patient's advanced age and dementia.  I discussed this at length with daughter.  This is consistent with her wishes.  She would not want acute surgical intervention.  We discussed patient going home with daughter.  Hold aspirin.  Recheck any marked change in her behavior or mental status.  She was made aware of the calcifications as well with a similar plan of being aware of these but not wishing any additional evaluation for possibility of subtle abnormality, malignancy, bleeding, or advanced imaging.   ED Treatments / Results  Labs (all labs ordered are listed, but only abnormal results are displayed) Labs Reviewed - No data to display  EKG None  Radiology Ct Head Wo Contrast  Result Date: 04/06/2018 CLINICAL DATA:  Cervical spine trauma.  Fall with head injury. EXAM: CT HEAD WITHOUT CONTRAST CT CERVICAL SPINE WITHOUT CONTRAST TECHNIQUE: Multidetector CT imaging of  the head and cervical spine was performed following the standard protocol without intravenous contrast. Multiplanar CT image reconstructions of the cervical spine were also generated. COMPARISON:  None. FINDINGS: CT HEAD FINDINGS Brain: Mild dural thickening seen along the anterior and posterior falx and right more than left tentorium. In the setting of trauma this is presumably minimal subdural hematoma, up to 2 mm in thickness and without mass effect. No evidence of subarachnoid hemorrhage or contusion. There is nodular dystrophic calcification in the suprasellar cistern, contacting but not clearly arising from the dilated and elongated basilar which measures 7 mm in diameter. Calcification measures up to 14 mm. Calcifying mass in this location is often related to craniopharyngioma, but no neighboring noncalcified mass is seen. Additionally, there is a smaller focus of dystrophic  appearing calcification in the lower right sylvian fissure. Calcifying dermoid/epidermoid spectrum or remote basal meningitis are considerations. No dural contact for meningioma. Reportedly the patient has significant dementia. If appropriate for comorbidities a pituitary protocol brain MRI might narrow the differential. Ventriculomegaly which is likely from central predominant atrophy. Smoothly contoured low densities in the right putamen favoring dilated perivascular spaces given the rounded shape. Small remote left cerebellar infarct. Vascular: Dilated distal basilar which is also atherosclerotic, 7 mm in diameter. Skull: Contusion along the left frontal and parietal scalp. Sinuses/Orbits: No evidence of injury CT CERVICAL SPINE FINDINGS Alignment: No traumatic malalignment. Skull base and vertebrae: Negative for fracture Soft tissues and spinal canal: No prevertebral fluid or swelling. No visible canal hematoma. Disc levels: Diffuse degenerative disc narrowing, endplate irregularity, and spurring. Upper chest: Negative IMPRESSION: 1. Intermittent mild dural thickening seen along the falx and tentorium compatible with trace subdural hematoma in this setting. Thickness only measures up to 2 mm and there is no mass effect. 2. Unusual dystrophic calcification at the suprasellar cistern and in the lower right sylvian fissure. Please see further discussion above. 3. Ectatic distal basilar measuring up to 7 mm in diameter. 4. No evidence of cervical spine fracture. Electronically Signed   By: Marnee Spring M.D.   On: 04/06/2018 11:08   Ct Cervical Spine Wo Contrast  Result Date: 04/06/2018 CLINICAL DATA:  Cervical spine trauma.  Fall with head injury. EXAM: CT HEAD WITHOUT CONTRAST CT CERVICAL SPINE WITHOUT CONTRAST TECHNIQUE: Multidetector CT imaging of the head and cervical spine was performed following the standard protocol without intravenous contrast. Multiplanar CT image reconstructions of the cervical  spine were also generated. COMPARISON:  None. FINDINGS: CT HEAD FINDINGS Brain: Mild dural thickening seen along the anterior and posterior falx and right more than left tentorium. In the setting of trauma this is presumably minimal subdural hematoma, up to 2 mm in thickness and without mass effect. No evidence of subarachnoid hemorrhage or contusion. There is nodular dystrophic calcification in the suprasellar cistern, contacting but not clearly arising from the dilated and elongated basilar which measures 7 mm in diameter. Calcification measures up to 14 mm. Calcifying mass in this location is often related to craniopharyngioma, but no neighboring noncalcified mass is seen. Additionally, there is a smaller focus of dystrophic appearing calcification in the lower right sylvian fissure. Calcifying dermoid/epidermoid spectrum or remote basal meningitis are considerations. No dural contact for meningioma. Reportedly the patient has significant dementia. If appropriate for comorbidities a pituitary protocol brain MRI might narrow the differential. Ventriculomegaly which is likely from central predominant atrophy. Smoothly contoured low densities in the right putamen favoring dilated perivascular spaces given the rounded shape. Small remote left cerebellar infarct.  Vascular: Dilated distal basilar which is also atherosclerotic, 7 mm in diameter. Skull: Contusion along the left frontal and parietal scalp. Sinuses/Orbits: No evidence of injury CT CERVICAL SPINE FINDINGS Alignment: No traumatic malalignment. Skull base and vertebrae: Negative for fracture Soft tissues and spinal canal: No prevertebral fluid or swelling. No visible canal hematoma. Disc levels: Diffuse degenerative disc narrowing, endplate irregularity, and spurring. Upper chest: Negative IMPRESSION: 1. Intermittent mild dural thickening seen along the falx and tentorium compatible with trace subdural hematoma in this setting. Thickness only measures up to 2  mm and there is no mass effect. 2. Unusual dystrophic calcification at the suprasellar cistern and in the lower right sylvian fissure. Please see further discussion above. 3. Ectatic distal basilar measuring up to 7 mm in diameter. 4. No evidence of cervical spine fracture. Electronically Signed   By: Marnee Spring M.D.   On: 04/06/2018 11:08    Procedures Procedures (including critical care time)  Medications Ordered in ED Medications  lidocaine-EPINEPHrine (XYLOCAINE W/EPI) 2 %-1:200000 (PF) injection 10 mL (10 mLs Infiltration Given 04/06/18 0913)     Initial Impression / Assessment and Plan / ED Course  I have reviewed the triage vital signs and the nursing notes.  Pertinent labs & imaging results that were available during my care of the patient were reviewed by me and considered in my medical decision making (see chart for details).      Final Clinical Impressions(s) / ED Diagnoses   Final diagnoses:  Laceration of scalp, initial encounter  Fall, initial encounter    ED Discharge Orders    None       Rolland Porter, MD 04/06/18 1155

## 2018-04-08 DIAGNOSIS — S0101XD Laceration without foreign body of scalp, subsequent encounter: Secondary | ICD-10-CM | POA: Diagnosis not present

## 2018-04-08 DIAGNOSIS — I10 Essential (primary) hypertension: Secondary | ICD-10-CM | POA: Diagnosis not present

## 2018-04-11 DIAGNOSIS — I10 Essential (primary) hypertension: Secondary | ICD-10-CM | POA: Diagnosis not present

## 2018-04-11 DIAGNOSIS — S0101XD Laceration without foreign body of scalp, subsequent encounter: Secondary | ICD-10-CM | POA: Diagnosis not present

## 2018-04-18 DIAGNOSIS — S0101XD Laceration without foreign body of scalp, subsequent encounter: Secondary | ICD-10-CM | POA: Diagnosis not present

## 2018-04-18 DIAGNOSIS — I10 Essential (primary) hypertension: Secondary | ICD-10-CM | POA: Diagnosis not present

## 2018-04-24 DIAGNOSIS — I10 Essential (primary) hypertension: Secondary | ICD-10-CM | POA: Diagnosis not present

## 2018-04-24 DIAGNOSIS — S0101XD Laceration without foreign body of scalp, subsequent encounter: Secondary | ICD-10-CM | POA: Diagnosis not present

## 2018-10-30 ENCOUNTER — Other Ambulatory Visit: Payer: Self-pay | Admitting: Nurse Practitioner

## 2018-10-30 ENCOUNTER — Ambulatory Visit
Admission: RE | Admit: 2018-10-30 | Discharge: 2018-10-30 | Disposition: A | Discharge status: Home / Self Care | Payer: Medicare Other | Source: Ambulatory Visit | Attending: Nurse Practitioner | Admitting: Nurse Practitioner

## 2018-10-30 DIAGNOSIS — M25561 Pain in right knee: Secondary | ICD-10-CM

## 2018-11-15 ENCOUNTER — Other Ambulatory Visit: Payer: Self-pay | Admitting: Family Medicine

## 2018-11-15 DIAGNOSIS — M1990 Unspecified osteoarthritis, unspecified site: Secondary | ICD-10-CM

## 2019-02-19 ENCOUNTER — Ambulatory Visit (HOSPITAL_COMMUNITY)
Admission: RE | Admit: 2019-02-19 | Discharge: 2019-02-19 | Disposition: A | Discharge status: Home / Self Care | Payer: Medicare (Managed Care) | Source: Ambulatory Visit | Attending: Nurse Practitioner | Admitting: Nurse Practitioner

## 2019-02-19 ENCOUNTER — Other Ambulatory Visit (HOSPITAL_COMMUNITY): Payer: Self-pay | Admitting: Nurse Practitioner

## 2019-02-19 DIAGNOSIS — M25561 Pain in right knee: Secondary | ICD-10-CM | POA: Diagnosis present

## 2019-02-23 ENCOUNTER — Other Ambulatory Visit: Payer: Self-pay | Admitting: Nurse Practitioner

## 2019-02-23 ENCOUNTER — Ambulatory Visit
Admission: RE | Admit: 2019-02-23 | Discharge: 2019-02-23 | Disposition: A | Discharge status: Home / Self Care | Payer: Medicare (Managed Care) | Source: Ambulatory Visit | Attending: Nurse Practitioner | Admitting: Nurse Practitioner

## 2019-02-23 DIAGNOSIS — R52 Pain, unspecified: Secondary | ICD-10-CM

## 2021-06-22 ENCOUNTER — Other Ambulatory Visit: Payer: Self-pay

## 2021-06-22 ENCOUNTER — Emergency Department (HOSPITAL_COMMUNITY): Payer: Medicare (Managed Care)

## 2021-06-22 ENCOUNTER — Emergency Department (HOSPITAL_COMMUNITY)
Admission: EM | Admit: 2021-06-22 | Discharge: 2021-06-23 | Disposition: A | Discharge status: Home / Self Care | Payer: Medicare (Managed Care) | Attending: Emergency Medicine | Admitting: Emergency Medicine

## 2021-06-22 ENCOUNTER — Encounter (HOSPITAL_COMMUNITY): Payer: Self-pay

## 2021-06-22 DIAGNOSIS — M25572 Pain in left ankle and joints of left foot: Secondary | ICD-10-CM | POA: Diagnosis not present

## 2021-06-22 DIAGNOSIS — E119 Type 2 diabetes mellitus without complications: Secondary | ICD-10-CM | POA: Diagnosis not present

## 2021-06-22 DIAGNOSIS — Z7984 Long term (current) use of oral hypoglycemic drugs: Secondary | ICD-10-CM | POA: Insufficient documentation

## 2021-06-22 DIAGNOSIS — M25562 Pain in left knee: Secondary | ICD-10-CM | POA: Insufficient documentation

## 2021-06-22 DIAGNOSIS — M25552 Pain in left hip: Secondary | ICD-10-CM | POA: Diagnosis present

## 2021-06-22 DIAGNOSIS — Y9389 Activity, other specified: Secondary | ICD-10-CM | POA: Diagnosis not present

## 2021-06-22 DIAGNOSIS — W010XXA Fall on same level from slipping, tripping and stumbling without subsequent striking against object, initial encounter: Secondary | ICD-10-CM | POA: Insufficient documentation

## 2021-06-22 DIAGNOSIS — Y92009 Unspecified place in unspecified non-institutional (private) residence as the place of occurrence of the external cause: Secondary | ICD-10-CM | POA: Diagnosis not present

## 2021-06-22 DIAGNOSIS — S82892A Other fracture of left lower leg, initial encounter for closed fracture: Secondary | ICD-10-CM

## 2021-06-22 DIAGNOSIS — W19XXXA Unspecified fall, initial encounter: Secondary | ICD-10-CM

## 2021-06-22 DIAGNOSIS — Z7982 Long term (current) use of aspirin: Secondary | ICD-10-CM | POA: Insufficient documentation

## 2021-06-22 DIAGNOSIS — I10 Essential (primary) hypertension: Secondary | ICD-10-CM | POA: Insufficient documentation

## 2021-06-22 DIAGNOSIS — Z79899 Other long term (current) drug therapy: Secondary | ICD-10-CM | POA: Insufficient documentation

## 2021-06-22 DIAGNOSIS — F0390 Unspecified dementia without behavioral disturbance: Secondary | ICD-10-CM | POA: Insufficient documentation

## 2021-06-22 MED ORDER — ACETAMINOPHEN 325 MG PO TABS
650.0000 mg | ORAL_TABLET | Freq: Once | ORAL | Status: AC
Start: 2021-06-22 — End: 2021-06-23
  Administered 2021-06-23: 650 mg via ORAL
  Filled 2021-06-22: qty 2

## 2021-06-22 NOTE — ED Provider Notes (Signed)
[facility]-EMERGENCY DEPT Provider Note   CSN: [id number] Arrival date & time: 06/22/21  2103     History Chief Complaint  Patient presents with   Sydney Horton is a 80 y.o. female.  The history is provided by the patient and a relative.  Fall Sydney Horton is a 80 y.o. female who presents to the Emergency Department complaining of fall.  Level V caveat due to dementia.  Hx is provided by the patient's daughter. She presents the emergency department by EMS for evaluation following a fall at home. She was on the bedside commode when she was reaching over to grab it depends when she slipped and fell, landing on the floor. No head injury or loss of consciousness. She did land on her left leg. She complained of pain to her left hip at home. In the emergency department she complains of pain to the knee and ankle. She baseline ambulates with a walker. She does not take any blood thinners. No recent illnesses.     Past Medical History:  Diagnosis Date   Blind right eye    Dementia (HCC)    Diabetes mellitus without complication (HCC)    High cholesterol    Hypertension    Stroke (HCC)     There are no problems to display for this patient.   Past Surgical History:  Procedure Laterality Date   ABDOMINAL HYSTERECTOMY     APPENDECTOMY     CHOLECYSTECTOMY     COLON SURGERY       OB History   No obstetric history on file.     History reviewed. No pertinent family history.  Social History   Tobacco Use   Smoking status: Never   Smokeless tobacco: Never  Substance Use Topics   Alcohol use: Never   Drug use: Never    Home Medications Prior to Admission medications   Medication Sig Start Date End Date Taking? Authorizing Provider  aspirin EC 81 MG tablet Take 81 mg by mouth daily.    [provider]  benazepril (LOTENSIN) 10 MG tablet Take 10 mg by mouth daily. 03/23/18   [provider]  memantine (NAMENDA XR) 28 MG  CP24 24 hr capsule Take 28 mg by mouth daily. 03/19/18   [provider]  metFORMIN (GLUCOPHAGE) 1000 MG tablet Take 1,000 mg by mouth 2 (two) times daily. 03/19/18   [provider]  MYRBETRIQ 25 MG TB24 tablet Take 25 mg by mouth daily. 03/20/18   [provider]  rivastigmine (EXELON) 13.3 MG/24HR Place 1 patch onto the skin daily. 03/19/18   [provider]  sertraline (ZOLOFT) 25 MG tablet Take 25 mg by mouth daily. 03/23/18   [provider]    Allergies    Patient has no known allergies.  Review of Systems   Review of Systems  All other systems reviewed and are negative.  Physical Exam Updated Vital Signs BP (!) 163/96   Pulse 100   Temp 98.5 F (36.9 C) (Oral)   Resp 18   SpO2 96%   Physical Exam Vitals and nursing note reviewed.  Constitutional:      Appearance: She is well-developed.  HENT:     Head: Normocephalic and atraumatic.  Cardiovascular:     Rate and Rhythm: Normal rate and regular rhythm.  Pulmonary:     Effort: Pulmonary effort is normal. No respiratory distress.  Abdominal:     Palpations: Abdomen is soft.  Tenderness: There is no abdominal tenderness. There is no guarding or rebound.  Musculoskeletal:     Comments: 2+ DP pulses bilaterally. One plus pitting edema to bilateral lower extremities. No significant hip tenderness to palpation. There is mild tenderness to palpation over the left knee, left ankle. She is able to range both the knee and ankle.  Skin:    General: Skin is warm and dry.  Neurological:     Mental Status: She is alert.     Comments: Oriented to person. Disoriented to place in time.  Psychiatric:        Behavior: Behavior normal.    ED Results / Procedures / Treatments   Labs (all labs ordered are listed, but only abnormal results are displayed) Labs Reviewed - No data to display  EKG None  Radiology DG Hip Unilat With Pelvis 2-3 Views Left  Result Date: 06/22/2021 CLINICAL  DATA:  Hip pain EXAM: DG HIP (WITH OR WITHOUT PELVIS) 2-3V LEFT COMPARISON:  06/22/2021 FINDINGS: Hip joints and SI joints symmetric. No acute bony abnormality. Specifically, no fracture, subluxation, or dislocation. IMPRESSION: No acute bony abnormality. Electronically Signed   By: Charlett Nose M.D.   On: 06/22/2021 22:04   DG Hip Unilat  With Pelvis 2-3 Views Right  Result Date: 06/22/2021 CLINICAL DATA:  Fall, hip pain EXAM: DG HIP (WITH OR WITHOUT PELVIS) 2-3V RIGHT COMPARISON:  None. FINDINGS: Hip joints and SI joints symmetric. No acute bony abnormality. Specifically, no fracture, subluxation, or dislocation. IMPRESSION: No acute bony abnormality. Electronically Signed   By: Charlett Nose M.D.   On: 06/22/2021 22:05    Procedures Procedures   Medications Ordered in ED Medications  acetaminophen (TYLENOL) tablet 650 mg (has no administration in time range)    ED Course  I have reviewed the triage vital signs and the nursing notes.  Pertinent labs & imaging results that were available during my care of the patient were reviewed by me and considered in my medical decision making (see chart for details).    MDM Rules/Calculators/A&P                         patient here for evaluation of leg pain following a fall at home. She does have mild tenderness on examination but is able to range her joints. Patient care transferred pending additional imaging.  Final Clinical Impression(s) / ED Diagnoses Final diagnoses:  None    Rx / DC Orders ED Discharge Orders     None        Tilden Fossa, MD 06/22/21 2333

## 2021-06-22 NOTE — ED Triage Notes (Signed)
Pt BIB EMS from home. Pt has a fall is now endorsing in left hip. Denies hitting her head and not on blood thinners. EMS reports rotation but no shortening. Hx of dementia.

## 2021-06-23 NOTE — ED Provider Notes (Signed)
I assumed care in signout to follow-up on x-rays.  Patient has a small avulsion fracture of left medial malleolus.  No other acute injuries.  Patient is otherwise awake alert in no acute distress.  She will use Tylenol for pain.  Cam walker ordered.  She already has a walker at home.  She will follow-up with orthopedic   Zadie Rhine, MD 06/23/21 0030

## 2021-06-25 ENCOUNTER — Encounter (INDEPENDENT_AMBULATORY_CARE_PROVIDER_SITE_OTHER): Payer: Medicare Other | Admitting: Ophthalmology

## 2021-06-25 ENCOUNTER — Other Ambulatory Visit: Payer: Self-pay

## 2021-06-25 ENCOUNTER — Encounter (INDEPENDENT_AMBULATORY_CARE_PROVIDER_SITE_OTHER): Payer: Medicare (Managed Care) | Admitting: Ophthalmology

## 2021-06-25 DIAGNOSIS — E113392 Type 2 diabetes mellitus with moderate nonproliferative diabetic retinopathy without macular edema, left eye: Secondary | ICD-10-CM | POA: Diagnosis not present

## 2021-06-25 DIAGNOSIS — H35033 Hypertensive retinopathy, bilateral: Secondary | ICD-10-CM

## 2021-06-25 DIAGNOSIS — H4311 Vitreous hemorrhage, right eye: Secondary | ICD-10-CM | POA: Diagnosis not present

## 2021-06-25 DIAGNOSIS — E113591 Type 2 diabetes mellitus with proliferative diabetic retinopathy without macular edema, right eye: Secondary | ICD-10-CM | POA: Diagnosis not present

## 2021-06-25 DIAGNOSIS — I10 Essential (primary) hypertension: Secondary | ICD-10-CM

## 2021-06-25 DIAGNOSIS — H43813 Vitreous degeneration, bilateral: Secondary | ICD-10-CM

## 2021-06-30 ENCOUNTER — Ambulatory Visit (INDEPENDENT_AMBULATORY_CARE_PROVIDER_SITE_OTHER): Payer: Medicare (Managed Care) | Admitting: Family

## 2021-06-30 DIAGNOSIS — S8252XA Displaced fracture of medial malleolus of left tibia, initial encounter for closed fracture: Secondary | ICD-10-CM

## 2021-07-03 ENCOUNTER — Encounter: Payer: Self-pay | Admitting: Family

## 2021-07-03 NOTE — Progress Notes (Signed)
   Office Visit Note   Patient: Sydney Horton           Date of Birth: 1941/05/09           MRN: [id number] Visit Date: 06/30/2021              Requested by: Renaye Rakers, MD [address] [place],  [place] [address] PCP: Renaye Rakers, MD  Chief Complaint  Patient presents with   Left Ankle - Fracture      HPI: The patient-year-old for evaluation of left ankle injury.  She is in the emergency department on June 27.  She does have dementia and resides with her daughter.  She uses a wheelchair for the majority of her day she has stand with a walker for transfer.  She was on the bedside commode and had a fall when she was leaving the provider.  Falling to her ankle.  Radiographs reveal a medial malleolus avulsion fracture. Presents today in CAM in wheelchair for mobility.  Assessment & Plan: Visit Diagnoses:  1. Closed avulsion fracture of medial malleolus of left tibia, initial encounter     Plan: will follow up in 3 more weeks with radiographs. May advance weight bearing in CAM as tolerated.  Instructions: No follow-ups on file.   Left Ankle Exam   Tenderness  The patient is experiencing tenderness in the medial malleolus and deltoid.  Swelling: moderate  Tests  Anterior drawer: negative  Other  Erythema: absent Pulse: present     Patient is alert, oriented, no adenopathy, well-dressed, normal affect, normal respiratory effort.   Imaging: No results found. No images are attached to the encounter.  Labs: No results found for: HGBA1C, ESRSEDRATE, CRP, LABURIC, REPTSTATUS, GRAMSTAIN, CULT, LABORGA   No results found for: ALBUMIN, PREALBUMIN, CBC  No results found for: MG No results found for: VD25OH  No results found for: PREALBUMIN No flowsheet data found.   There is no height or weight on file to calculate BMI.  Orders:  No orders of the defined types were placed in this encounter.  No orders of the defined types were placed in this  encounter.    Procedures: No procedures performed  Clinical Data: No additional findings.  ROS:  All other systems negative, except as noted in the HPI. Review of Systems  Objective: Vital Signs: There were no vitals taken for this visit.  Specialty Comments:  No specialty comments available.  PMFS History: There are no problems to display for this patient.  Past Medical History:  Diagnosis Date   Blind right eye    Dementia (HCC)    Diabetes mellitus without complication (HCC)    High cholesterol    Hypertension    Stroke [facility])     History reviewed. No pertinent family history.  Past Surgical History:  Procedure Laterality Date   ABDOMINAL HYSTERECTOMY     APPENDECTOMY     CHOLECYSTECTOMY     COLON SURGERY     Social History   Occupational History   Not on file  Tobacco Use   Smoking status: Never   Smokeless tobacco: Never  Substance and Sexual Activity   Alcohol use: Never   Drug use: Never   Sexual activity: Not on file

## 2021-07-13 ENCOUNTER — Encounter (INDEPENDENT_AMBULATORY_CARE_PROVIDER_SITE_OTHER): Payer: Medicare Other | Admitting: Ophthalmology

## 2021-07-21 ENCOUNTER — Ambulatory Visit: Payer: Medicare (Managed Care) | Admitting: Family

## 2021-07-24 ENCOUNTER — Ambulatory Visit: Payer: Medicare (Managed Care) | Admitting: Family

## 2021-07-28 ENCOUNTER — Ambulatory Visit: Payer: Medicare (Managed Care) | Admitting: Family

## 2021-08-05 ENCOUNTER — Ambulatory Visit: Payer: Self-pay

## 2021-08-05 ENCOUNTER — Encounter: Payer: Self-pay | Admitting: Physician Assistant

## 2021-08-05 ENCOUNTER — Ambulatory Visit (INDEPENDENT_AMBULATORY_CARE_PROVIDER_SITE_OTHER): Payer: Medicare (Managed Care) | Admitting: Physician Assistant

## 2021-08-05 DIAGNOSIS — S8252XA Displaced fracture of medial malleolus of left tibia, initial encounter for closed fracture: Secondary | ICD-10-CM

## 2021-08-05 NOTE — Progress Notes (Signed)
   Office Visit Note   Patient: Driana Dazey           Date of Birth: [birth year]           MRN: [id number] Visit Date: 08/05/2021              Requested by: Renaye Rakers, MD [address] [place],  [place] [address] PCP: Jethro Bastos, MD  No chief complaint on file.     HPI: Patient is a pleasant 80 year old woman who lives with her daughter.  She is here in follow-up for a medial malleoli are small avulsion fracture.  She has been wearing a cam walker boot and has been weightbearing as tolerated.  She denies any pain.  Assessment & Plan: Visit Diagnoses:  1. Closed avulsion fracture of medial malleolus of left tibia, initial encounter     Plan: Patient may transition to a regular supportive shoe.  Would be helpful her to engage in some physical therapy that focused on coordination and fall prevention.  May follow-up with [place] as needed  Follow-Up Instructions: No follow-ups on file.   Ortho Exam  Patient is alert, oriented, no adenopathy, well-dressed, normal affect, normal respiratory effort. Left ankle no swelling no ecchymosis she has no tenderness to palpation over the medial malleolus lateral malleolus.  Strength is fair.  Compartments soft and nontender  Imaging: No results found. No images are attached to the encounter.  Labs: No results found for: HGBA1C, ESRSEDRATE, CRP, LABURIC, REPTSTATUS, GRAMSTAIN, CULT, LABORGA   No results found for: ALBUMIN, PREALBUMIN, CBC  No results found for: MG No results found for: VD25OH  No results found for: PREALBUMIN No flowsheet data found.   There is no height or weight on file to calculate BMI.  Orders:  Orders Placed This Encounter  Procedures   XR Ankle 2 Views Left   Ambulatory referral to Physical Therapy   No orders of the defined types were placed in this encounter.    Procedures: No procedures performed  Clinical Data: No additional findings.  ROS:  All other systems negative, except as  noted in the HPI. Review of Systems  Objective: Vital Signs: There were no vitals taken for this visit.  Specialty Comments:  No specialty comments available.  PMFS History: There are no problems to display for this patient.  Past Medical History:  Diagnosis Date   Blind right eye    Dementia (HCC)    Diabetes mellitus without complication (HCC)    High cholesterol    Hypertension    Stroke [facility])     No family history on file.  Past Surgical History:  Procedure Laterality Date   ABDOMINAL HYSTERECTOMY     APPENDECTOMY     CHOLECYSTECTOMY     COLON SURGERY     Social History   Occupational History   Not on file  Tobacco Use   Smoking status: Never   Smokeless tobacco: Never  Substance and Sexual Activity   Alcohol use: Never   Drug use: Never   Sexual activity: Not on file

## 2021-08-31 ENCOUNTER — Emergency Department (HOSPITAL_COMMUNITY)
Admission: EM | Admit: 2021-08-31 | Discharge: 2021-09-02 | Disposition: A | Discharge status: Home / Self Care | Payer: Medicare (Managed Care) | Attending: Emergency Medicine | Admitting: Emergency Medicine

## 2021-08-31 ENCOUNTER — Emergency Department (HOSPITAL_COMMUNITY): Payer: Medicare (Managed Care)

## 2021-08-31 ENCOUNTER — Encounter (HOSPITAL_COMMUNITY): Payer: Self-pay | Admitting: *Deleted

## 2021-08-31 ENCOUNTER — Other Ambulatory Visit: Payer: Self-pay

## 2021-08-31 DIAGNOSIS — Z7984 Long term (current) use of oral hypoglycemic drugs: Secondary | ICD-10-CM | POA: Insufficient documentation

## 2021-08-31 DIAGNOSIS — R5383 Other fatigue: Secondary | ICD-10-CM | POA: Insufficient documentation

## 2021-08-31 DIAGNOSIS — Z20822 Contact with and (suspected) exposure to covid-19: Secondary | ICD-10-CM | POA: Insufficient documentation

## 2021-08-31 DIAGNOSIS — R29898 Other symptoms and signs involving the musculoskeletal system: Secondary | ICD-10-CM | POA: Diagnosis not present

## 2021-08-31 DIAGNOSIS — E119 Type 2 diabetes mellitus without complications: Secondary | ICD-10-CM | POA: Insufficient documentation

## 2021-08-31 DIAGNOSIS — Z79899 Other long term (current) drug therapy: Secondary | ICD-10-CM | POA: Insufficient documentation

## 2021-08-31 DIAGNOSIS — R531 Weakness: Secondary | ICD-10-CM

## 2021-08-31 DIAGNOSIS — I10 Essential (primary) hypertension: Secondary | ICD-10-CM | POA: Insufficient documentation

## 2021-08-31 DIAGNOSIS — F0390 Unspecified dementia without behavioral disturbance: Secondary | ICD-10-CM | POA: Diagnosis not present

## 2021-08-31 DIAGNOSIS — R262 Difficulty in walking, not elsewhere classified: Secondary | ICD-10-CM | POA: Insufficient documentation

## 2021-08-31 DIAGNOSIS — Z7982 Long term (current) use of aspirin: Secondary | ICD-10-CM | POA: Insufficient documentation

## 2021-08-31 LAB — CBC WITH DIFFERENTIAL/PLATELET
Abs Immature Granulocytes: 0.03 10*3/uL (ref 0.00–0.07)
Basophils Absolute: 0.1 10*3/uL (ref 0.0–0.1)
Basophils Relative: 1 %
Eosinophils Absolute: 0.2 10*3/uL (ref 0.0–0.5)
Eosinophils Relative: 2 %
HCT: 42.4 % (ref 36.0–46.0)
Hemoglobin: 13.8 g/dL (ref 12.0–15.0)
Immature Granulocytes: 0 %
Lymphocytes Relative: 32 %
Lymphs Abs: 3.1 10*3/uL (ref 0.7–4.0)
MCH: 29.8 pg (ref 26.0–34.0)
MCHC: 32.5 g/dL (ref 30.0–36.0)
MCV: 91.6 fL (ref 80.0–100.0)
Monocytes Absolute: 0.8 10*3/uL (ref 0.1–1.0)
Monocytes Relative: 8 %
Neutro Abs: 5.6 10*3/uL (ref 1.7–7.7)
Neutrophils Relative %: 57 %
Platelets: 346 10*3/uL (ref 150–400)
RBC: 4.63 MIL/uL (ref 3.87–5.11)
RDW: 13.7 % (ref 11.5–15.5)
WBC: 9.8 10*3/uL (ref 4.0–10.5)
nRBC: 0 % (ref 0.0–0.2)

## 2021-08-31 LAB — COMPREHENSIVE METABOLIC PANEL
ALT: 26 U/L (ref 0–44)
AST: 25 U/L (ref 15–41)
Albumin: 3.6 g/dL (ref 3.5–5.0)
Alkaline Phosphatase: 70 U/L (ref 38–126)
Anion gap: 7 (ref 5–15)
BUN: 21 mg/dL (ref 8–23)
CO2: 22 mmol/L (ref 22–32)
Calcium: 9.7 mg/dL (ref 8.9–10.3)
Chloride: 110 mmol/L (ref 98–111)
Creatinine, Ser: 0.9 mg/dL (ref 0.44–1.00)
GFR, Estimated: >60 mL/min (ref >60)
Glucose, Bld: 232 mg/dL — ABNORMAL HIGH (ref 70–99)
Potassium: 3.3 mmol/L — ABNORMAL LOW (ref 3.5–5.1)
Sodium: 139 mmol/L (ref 135–145)
Total Bilirubin: 0.9 mg/dL (ref 0.3–1.2)
Total Protein: 6.6 g/dL (ref 6.5–8.1)

## 2021-08-31 LAB — URINALYSIS, ROUTINE W REFLEX MICROSCOPIC
Bilirubin Urine: NEGATIVE
Glucose, UA: 100 mg/dL — AB
Hgb urine dipstick: NEGATIVE
Ketones, ur: NEGATIVE mg/dL
Nitrite: NEGATIVE
Protein, ur: NEGATIVE mg/dL
Specific Gravity, Urine: 1.01 (ref 1.005–1.030)
pH: 7.5 (ref 5.0–8.0)

## 2021-08-31 LAB — LIPASE, BLOOD: Lipase: 55 U/L — ABNORMAL HIGH (ref 11–51)

## 2021-08-31 LAB — RESP PANEL BY RT-PCR (FLU A&B, COVID) ARPGX2
Influenza A by PCR: NEGATIVE
Influenza B by PCR: NEGATIVE
SARS Coronavirus 2 by RT PCR: NEGATIVE

## 2021-08-31 LAB — URINALYSIS, MICROSCOPIC (REFLEX)

## 2021-08-31 LAB — TROPONIN I (HIGH SENSITIVITY): Troponin I (High Sensitivity): <2 ng/L (ref <18)

## 2021-08-31 MED ORDER — EMPAGLIFLOZIN 10 MG PO TABS
10.0000 mg | ORAL_TABLET | Freq: Every morning | ORAL | Status: DC
  Administered 2021-09-01: 10 mg via ORAL
  Filled 2021-08-31: qty 1

## 2021-08-31 MED ORDER — VITAMIN B-12 1000 MCG PO TABS
1000.0000 ug | ORAL_TABLET | Freq: Every morning | ORAL | Status: DC
  Administered 2021-09-01: 1000 ug via ORAL
  Filled 2021-08-31: qty 1

## 2021-08-31 MED ORDER — MEMANTINE HCL 5 MG PO TABS
10.0000 mg | ORAL_TABLET | Freq: Two times a day (BID) | ORAL | Status: DC
  Administered 2021-08-31 – 2021-09-01 (×2): 10 mg via ORAL
  Filled 2021-08-31 (×2): qty 2

## 2021-08-31 MED ORDER — VITAMIN D3 25 MCG (1000 UNIT) PO TABS
1000.0000 [IU] | ORAL_TABLET | Freq: Every morning | ORAL | Status: DC
  Administered 2021-09-01: 1000 [IU] via ORAL
  Filled 2021-08-31: qty 1

## 2021-08-31 MED ORDER — DONEPEZIL HCL 5 MG PO TABS
10.0000 mg | ORAL_TABLET | Freq: Every day | ORAL | Status: DC
  Administered 2021-08-31: 10 mg via ORAL
  Filled 2021-08-31: qty 2

## 2021-08-31 MED ORDER — ATORVASTATIN CALCIUM 40 MG PO TABS
40.0000 mg | ORAL_TABLET | Freq: Every day | ORAL | Status: DC
  Administered 2021-08-31: 40 mg via ORAL
  Filled 2021-08-31: qty 1

## 2021-08-31 MED ORDER — ASPIRIN EC 81 MG PO TBEC
81.0000 mg | DELAYED_RELEASE_TABLET | Freq: Every morning | ORAL | Status: DC
  Administered 2021-09-01: 81 mg via ORAL
  Filled 2021-08-31: qty 1

## 2021-08-31 MED ORDER — SENNOSIDES-DOCUSATE SODIUM 8.6-50 MG PO TABS
1.0000 | ORAL_TABLET | Freq: Every day | ORAL | Status: DC
  Administered 2021-08-31: 1 via ORAL
  Filled 2021-08-31: qty 1

## 2021-08-31 MED ORDER — AMLODIPINE BESYLATE 5 MG PO TABS
5.0000 mg | ORAL_TABLET | Freq: Every day | ORAL | Status: DC
  Administered 2021-09-01: 5 mg via ORAL
  Filled 2021-08-31: qty 1

## 2021-08-31 MED ORDER — METFORMIN HCL 500 MG PO TABS: 1000.0000 mg | ORAL_TABLET | Freq: Two times a day (BID) | ORAL | Status: DC

## 2021-08-31 MED ORDER — METFORMIN HCL 500 MG PO TABS
1000.0000 mg | ORAL_TABLET | Freq: Two times a day (BID) | ORAL | Status: DC
  Administered 2021-08-31 – 2021-09-01 (×3): 1000 mg via ORAL
  Filled 2021-08-31 (×3): qty 2

## 2021-08-31 MED ORDER — ASCORBIC ACID 500 MG PO TABS
500.0000 mg | ORAL_TABLET | Freq: Every morning | ORAL | Status: DC
  Administered 2021-09-01: 500 mg via ORAL
  Filled 2021-08-31: qty 1

## 2021-08-31 MED ORDER — SODIUM CHLORIDE 0.9 % IV BOLUS
500.0000 mL | Freq: Once | INTRAVENOUS | Status: AC
  Administered 2021-08-31: 500 mL via INTRAVENOUS

## 2021-08-31 NOTE — TOC Initial Note (Signed)
Transition of Care [facility]) - Initial/Assessment Note    Patient Details  Name: Sydney Horton MRN: [id number] Date of Birth: 12-Sep-1941  Transition of [facility]) CM/SW Contact:    Carmina Miller, LCSWA Phone Number: 08/31/2021, 6:18 PM  Clinical Narrative:                 CSW received consult from MD in reference to pt possibly needing SNF vs 24 supervision at home. CSW spoke with pt's daughter by phone, pt's daughter states she believes pt needs rehab as she cannot continue to pick her up and take her all over the house and she needs pt to be as mobile as possible. Pt's daughter states pt has been to [facility] and [facility] and would prefer her to go back to [facility] due to the proximity to her home. Pt's daughter stated MD said pt would be staying overnight and PT would be by to see pt tomorrow. [facility] will need to be contacted once a formal recommendation is made for SNF. Pt has received both Covid vaccines and her booster. Insurance authorization process has been discussed as well as Medicare.gov website information.         Patient Goals and CMS Choice        Expected Discharge Plan and Services                                                Prior Living Arrangements/Services                       Activities of Daily Living      Permission Sought/Granted                  Emotional Assessment              Admission diagnosis:  weakness There are no problems to display for this patient.  PCP:  Jethro Bastos, MD Pharmacy:   [facility] - [place], [place] - [address] [place] [address] Phone: [phone] Fax: [phone]     Social Determinants of Health (SDOH) Interventions    Readmission Risk Interventions No flowsheet data found.

## 2021-08-31 NOTE — ED Provider Notes (Signed)
[facility]-EMERGENCY DEPT Provider Note   CSN: [id number] Arrival date & time: 08/31/21  1127     History Chief Complaint  Patient presents with   Weakness    Sydney Horton is a 80 y.o. female w/ hx of dementia, HTN, diabetes, presenting to ED with concern for weakness.  Her daughter Sydney Horton reports patient was exhausted yesterday, wouldn't get out of bed, states her legs were weak, and was stating she felt like she wanted to die.  This began yesterday abruptly.  Patient had been ambulatory with a walker until now.  Her daughter lives with her.  The patient herself is pleasantly demented but has no acute complaints on my exam.  She denies headache, chest pain, muscle pain, muscle soreness, nausea, vomiting.  Her daughter states this is very unusual for the patient.  She tells me yesterday the patient had difficult time bearing weight and cannot stand up from bed, which is an acute change, and the daughter herself cannot pick her up.  They have part-time health that comes out to the house.  Her daughter reports the patient has not had any fevers, chills, cough, congestion the past several days.  No nausea or vomiting    HPI     Past Medical History:  Diagnosis Date   Blind right eye    Dementia (HCC)    Diabetes mellitus without complication (HCC)    High cholesterol    Hypertension    Stroke (HCC)     There are no problems to display for this patient.   Past Surgical History:  Procedure Laterality Date   ABDOMINAL HYSTERECTOMY     APPENDECTOMY     CHOLECYSTECTOMY     COLON SURGERY       OB History   No obstetric history on file.     No family history on file.  Social History   Tobacco Use   Smoking status: Never   Smokeless tobacco: Never  Substance Use Topics   Alcohol use: Never   Drug use: Never    Home Medications Prior to Admission medications   Medication Sig Start Date End Date Taking? Authorizing Provider   amLODipine-benazepril (LOTREL) 5-20 MG capsule Take 1 capsule by mouth every morning.   Yes [provider]  aspirin EC 81 MG tablet Take 81 mg by mouth every morning.   Yes [provider]  atorvastatin (LIPITOR) 40 MG tablet Take 40 mg by mouth at bedtime.   Yes [provider]  cholecalciferol (VITAMIN D) 25 MCG (1000 UNIT) tablet Take 1,000 Units by mouth every morning.   Yes [provider]  donepezil (ARICEPT) 10 MG tablet Take 10 mg by mouth at bedtime.   Yes [provider]  ELDERBERRY PO Take 2,000 mg by mouth every morning.   Yes [provider]  empagliflozin (JARDIANCE) 10 MG TABS tablet Take 10 mg by mouth every morning.   Yes [provider]  memantine (NAMENDA) 10 MG tablet Take 10 mg by mouth 2 (two) times daily.   Yes [provider]  metFORMIN (GLUCOPHAGE) 1000 MG tablet Take 1,000 mg by mouth 2 (two) times daily. 03/19/18  Yes [provider]  senna-docusate (SENOKOT-S) 8.6-50 MG tablet Take 1 tablet by mouth at bedtime.   Yes [provider]  vitamin B-12 (CYANOCOBALAMIN) 1000 MCG tablet Take 1,000 mcg by mouth every morning.   Yes [provider]  vitamin C (ASCORBIC ACID) 500 MG tablet Take 500 mg by mouth  every morning.   Yes [provider]    Allergies    Patient has no known allergies.  Review of Systems   Review of Systems  Constitutional:  Negative for chills and fever.  HENT:  Negative for ear pain and sore throat.   Eyes:  Negative for pain and visual disturbance.  Respiratory:  Negative for cough and shortness of breath.   Cardiovascular:  Negative for chest pain and palpitations.  Gastrointestinal:  Negative for abdominal pain and vomiting.  Genitourinary:  Negative for dysuria and hematuria.  Musculoskeletal:  Negative for arthralgias and back pain.  Skin:  Negative for color change and rash.  Neurological:  Negative for syncope, light-headedness  and headaches.  All other systems reviewed and are negative.  Physical Exam Updated Vital Signs BP 136/80   Pulse 78   Temp 98 F (36.7 C) (Oral)   Resp 18   SpO2 97%   Physical Exam Constitutional:      General: She is not in acute distress. HENT:     Head: Normocephalic and atraumatic.  Eyes:     Conjunctiva/sclera: Conjunctivae normal.     Pupils: Pupils are equal, round, and reactive to light.  Cardiovascular:     Rate and Rhythm: Normal rate and regular rhythm.  Pulmonary:     Effort: Pulmonary effort is normal. No respiratory distress.  Abdominal:     General: There is no distension.     Tenderness: There is no abdominal tenderness.  Skin:    General: Skin is warm and dry.  Neurological:     General: No focal deficit present.     Mental Status: She is alert and oriented to person, place, and time. Mental status is at baseline.    ED Results / Procedures / Treatments   Labs (all labs ordered are listed, but only abnormal results are displayed) Labs Reviewed  COMPREHENSIVE METABOLIC PANEL - Abnormal; Notable for the following components:      Result Value   Potassium 3.3 (*)    Glucose, Bld 232 (*)    All other components within normal limits  LIPASE, BLOOD - Abnormal; Notable for the following components:   Lipase 55 (*)    All other components within normal limits  URINALYSIS, ROUTINE W REFLEX MICROSCOPIC - Abnormal; Notable for the following components:   Glucose, UA 100 (*)    Leukocytes,Ua SMALL (*)    All other components within normal limits  URINALYSIS, MICROSCOPIC (REFLEX) - Abnormal; Notable for the following components:   Bacteria, UA RARE (*)    All other components within normal limits  RESP PANEL BY RT-PCR (FLU A&B, COVID) ARPGX2  URINE CULTURE  CBC WITH DIFFERENTIAL/PLATELET  TROPONIN I (HIGH SENSITIVITY)    EKG EKG Interpretation  Date/Time:  Monday August 31 2021 12:57:46 EDT Ventricular Rate:  86 PR Interval:  170 QRS  Duration: 77 QT Interval:  369 QTC Calculation: 442 R Axis:   35 Text Interpretation: Sinus rhythm Confirmed by Alvester Chou [phone]) on 08/31/2021 2:45:12 PM  Radiology CT HEAD WO CONTRAST ( )  Result Date: 08/31/2021 CLINICAL DATA:  Weakness, lethargy, confusion for 2 days. EXAM: CT HEAD WITHOUT CONTRAST TECHNIQUE: Contiguous axial images were obtained from the base of the skull through the vertex without intravenous contrast. COMPARISON:  Report from CT head dated 04/06/2018. FINDINGS: Brain: No evidence of acute infarction, hemorrhage, hydrocephalus, extra-axial collection or mass lesion/mass effect. There is moderate cerebral volume loss with associated ex vacuo dilatation. Dystrophic calcifications in the  suprasellar cistern measure 17 x 7 x 7 mm. This is likely unchanged since 04/06/2018, however the exact etiology is unclear. Calcification is also seen in the right sylvian fissure, likely unchanged since 04/06/2018. Cysts near the right putamen likely represent dilated perivascular spaces. Vascular: There are vascular calcifications in the carotid siphons. Skull: Normal. Negative for fracture or focal lesion. Sinuses/Orbits: No acute finding. Other: None. IMPRESSION: 1. No acute intracranial process. 2. Dystrophic calcification in the suprasellar cistern is indeterminate but likely unchanged since 04/06/2018. Non emergent MR brain could be considered for further evaluation if clinically appropriate. Electronically Signed   By: Romona Curlsyler  Litton M.D.   On: 08/31/2021 15:23   DG Pelvis Portable  Result Date: 08/31/2021 CLINICAL DATA:  Difficulty walking began yesterday. Unable to bear weight. Unsteady. EXAM: PORTABLE PELVIS 1-2 VIEWS COMPARISON:  None. FINDINGS: Degenerative changes are noted in the lower lumbar spine. Single AP view of the pelvis demonstrates no acute fractures. Hips are remarkable. IMPRESSION: 1. No acute abnormality. 2. Degenerative changes in the lower lumbar spine. Electronically  Signed   By: Marin Robertshristopher  Mattern M.D.   On: 08/31/2021 17:16   DG Chest Portable 1 View  Result Date: 08/31/2021 CLINICAL DATA:  Sudden onset of weakness with unsteadiness on feet for 2 days. Evaluate for infection. EXAM: PORTABLE CHEST 1 VIEW COMPARISON:  None. FINDINGS: 1244 hours. The heart size and mediastinal contours are normal. The lungs are clear. There is no pleural effusion or pneumothorax. No acute osseous findings are identified. Prominent paraspinal osteophytes are noted in the thoracic spine. IMPRESSION: No active cardiopulmonary process. Electronically Signed   By: Carey BullocksWilliam  Veazey M.D.   On: 08/31/2021 13:09    Procedures Procedures   Medications Ordered in ED Medications  aspirin EC tablet 81 mg (has no administration in time range)  atorvastatin (LIPITOR) tablet 40 mg (has no administration in time range)  donepezil (ARICEPT) tablet 10 mg (has no administration in time range)  empagliflozin (JARDIANCE) tablet 10 mg (has no administration in time range)  memantine (NAMENDA) tablet 10 mg (has no administration in time range)  senna-docusate (Senokot-S) tablet 1 tablet (has no administration in time range)  vitamin B-12 (CYANOCOBALAMIN) tablet 1,000 mcg (has no administration in time range)  ascorbic acid (VITAMIN C) tablet 500 mg (has no administration in time range)  cholecalciferol (VITAMIN D) tablet 1,000 Units (has no administration in time range)  amLODipine (NORVASC) tablet 5 mg (has no administration in time range)  metFORMIN (GLUCOPHAGE) tablet 1,000 mg (has no administration in time range)  sodium chloride 0.9 % bolus 500 mL (500 mLs Intravenous New Bag/Given 08/31/21 1241)    ED Course  I have reviewed the triage vital signs and the nursing notes.  Pertinent labs & imaging results that were available during my care of the patient were reviewed by me and considered in my medical decision making (see chart for details).  This is an 80 year old female who has a  history of dementia, lives with her daughter, presented ED with new onset of fatigue, generalized weakness for 2 days.  There was a diagnosis includes infection versus dehydration versus CVA versus other.  Supplemental history was provided by the patient's daughter at bedside.  Labs reviewed no focal findings.  No evidence of anemia, significant dehydration, leukocytosis, fever, infection.  CT scan of the brain reviewed with no acute findings to explain her bilateral leg weakness.  She does have range of motion at the hip with minimal tenderness, x-rays also did not show  occult fracture.  UA without clear sign of infection.  COVID is negative.  Supplemental history provided by her daughter.  Clinical Course as of 08/31/21 1719  Mon Aug 31, 2021  1631 Home meds reordered.  Regular diet ordered.  Patient and daughter updated.  No acute findings on emergent w/u for leg weakness.  Will need PT evaluation/ SW consult for home health assistance vs rehab [MT]    Clinical Course User Index [MT] Kadi Hession, Kermit Balo, MD    Final Clinical Impression(s) / ED Diagnoses Final diagnoses:  None    Rx / DC Orders ED Discharge Orders     None        Terald Sleeper, MD 08/31/21 1719

## 2021-08-31 NOTE — ED Notes (Signed)
Pt placed on purewick 

## 2021-08-31 NOTE — ED Triage Notes (Signed)
Per EMS, pt complains of being weak and having difficulty walking for the past 2 days. Daughter called EMS when patient said "I want to die". Pt denies SI to EMS. Pt uses walker and wheelchair. Pt given 150cc NS en route.  CBG 331 BP 165/93 HR 93 NSR O2 92% on RA

## 2021-09-01 NOTE — ED Provider Notes (Signed)
Pt here awaiting placement.  Pt accepted to [facility].  She is stable for d/c.  Return if worse.  F/u with pcp.   Jacalyn Lefevre, MD 09/01/21 1730

## 2021-09-01 NOTE — ED Provider Notes (Signed)
Emergency Medicine Observation Re-evaluation Note  Sydney Horton is a 80 y.o. female, seen on rounds today.  Pt initially presented to the ED for complaints of Weakness Currently, the patient is awaiting social work disposition. Patient with history of dementia and decreased mobility presenting from home.  She may need SNF versus 24-hour care at home. Physical Exam  BP 121/78   Pulse 68   Temp 98 F (36.7 C) (Oral)   Resp 15   SpO2 96%  Physical Exam General: Awake, no distress, pleasantly interactive Cardiac: Normal rate and rhythm Lungs: Breathing even and unlabored Psych: No agitation, not responding to internal stimuli  ED Course / MDM  EKG:EKG Interpretation  Date/Time:  Monday August 31 2021 12:57:46 EDT Ventricular Rate:  86 PR Interval:  170 QRS Duration: 77 QT Interval:  369 QTC Calculation: 442 R Axis:   35 Text Interpretation: Sinus rhythm Confirmed by Alvester Chou [phone]) on 08/31/2021 2:45:12 PM  I have reviewed the labs performed to date as well as medications administered while in observation.  Recent changes in the last 24 hours include evaluated by physical therapy this morning.  Awaiting possible SNF placement.  Plan  Current plan is for social work disposition.  Sydney Horton is not under involuntary commitment.     Gloris Manchester, MD 09/01/21 [phone]

## 2021-09-01 NOTE — ED Notes (Signed)
Attempted to call report, no answer

## 2021-09-01 NOTE — Progress Notes (Signed)
CSW received Call from [facility], pt was approved for SNF for 1 week. CSW to completed FL2 and fax pt out.   Valentina Shaggy.Nikesha Kwasny, MSW, LCSWA [facility]  Transitions of Care Clinical Social Worker I Direct Dial: [phone]  Fax: [phone] [email or web address]

## 2021-09-01 NOTE — NC FL2 (Signed)
[place] MEDICAID FL2 LEVEL OF CARE SCREENING TOOL     IDENTIFICATION  Patient Name: Sydney Horton Birthdate: 06-24-1941 Sex: female Admission Date (Current Location): 08/31/2021  [facility] and [place] Number:  Producer, television/film/video and Address:  [facility],  [address], [place] [address]      Provider Number: [id number]  Attending Physician Name and Address:  Default, Provider, MD  Relative Name and Phone Number:  Koleen, Celia (Daughter)   [phone] (Mobile)    Current Level of Care: Hospital Recommended Level of Care: Skilled Nursing Facility Prior Approval Number:    Date Approved/Denied:   PASRR Number: [id number]  Discharge Plan: SNF    Current Diagnoses: There are no problems to display for this patient.   Orientation RESPIRATION BLADDER Height & Weight     Self, Time, Situation, Place  Normal Continent Weight:   Height:     BEHAVIORAL SYMPTOMS/MOOD NEUROLOGICAL BOWEL NUTRITION STATUS      Continent Diet (regular)  AMBULATORY STATUS COMMUNICATION OF NEEDS Skin   Limited Assist Verbally Normal                       Personal Care Assistance Level of Assistance  Bathing, Feeding, Dressing Bathing Assistance: Independent Feeding assistance: Independent Dressing Assistance: Independent     Functional Limitations Info  Sight, Hearing, Speech Sight Info: Adequate Hearing Info: Adequate Speech Info: Adequate    SPECIAL CARE FACTORS FREQUENCY  PT (By licensed PT), OT (By licensed OT)     PT Frequency: 5 x a week OT Frequency: 5 x a week            Contractures Contractures Info: Not present    Additional Factors Info  Code Status, Allergies Code Status Info: full Allergies Info: NKA           Current Medications (09/01/2021):  This is the current hospital active medication list Current Facility-Administered Medications  Medication Dose Route Frequency Provider Last Rate Last Admin   amLODipine (NORVASC) tablet 5  mg  5 mg Oral Daily Terald Sleeper, MD   5 mg at 09/01/21 8469   ascorbic acid (VITAMIN C) tablet 500 mg  500 mg Oral q morning Terald Sleeper, MD   500 mg at 09/01/21 0930   aspirin EC tablet 81 mg  81 mg Oral q morning Terald Sleeper, MD   81 mg at 09/01/21 6295   atorvastatin (LIPITOR) tablet 40 mg  40 mg Oral QHS Terald Sleeper, MD   40 mg at 08/31/21 2210   cholecalciferol (VITAMIN D) tablet 1,000 Units  1,000 Units Oral q morning Terald Sleeper, MD   1,000 Units at 09/01/21 0929   donepezil (ARICEPT) tablet 10 mg  10 mg Oral QHS Terald Sleeper, MD   10 mg at 08/31/21 2210   empagliflozin (JARDIANCE) tablet 10 mg  10 mg Oral q morning Terald Sleeper, MD   10 mg at 09/01/21 0929   memantine (NAMENDA) tablet 10 mg  10 mg Oral BID Terald Sleeper, MD   10 mg at 09/01/21 2841   metFORMIN (GLUCOPHAGE) tablet 1,000 mg  1,000 mg Oral BID WC Terald Sleeper, MD   1,000 mg at 09/01/21 0756   senna-docusate (Senokot-S) tablet 1 tablet  1 tablet Oral QHS Terald Sleeper, MD   1 tablet at 08/31/21 2210   vitamin B-12 (CYANOCOBALAMIN) tablet 1,000 mcg  1,000 mcg Oral q morning Trifan, Kermit Balo, MD  1,000 mcg at 09/01/21 0930   Current Outpatient Medications  Medication Sig Dispense Refill   amLODipine-benazepril (LOTREL) 5-20 MG capsule Take 1 capsule by mouth every morning.     aspirin EC 81 MG tablet Take 81 mg by mouth every morning.     atorvastatin (LIPITOR) 40 MG tablet Take 40 mg by mouth at bedtime.     cholecalciferol (VITAMIN D) 25 MCG (1000 UNIT) tablet Take 1,000 Units by mouth every morning.     donepezil (ARICEPT) 10 MG tablet Take 10 mg by mouth at bedtime.     ELDERBERRY PO Take 2,000 mg by mouth every morning.     empagliflozin (JARDIANCE) 10 MG TABS tablet Take 10 mg by mouth every morning.     memantine (NAMENDA) 10 MG tablet Take 10 mg by mouth 2 (two) times daily.     metFORMIN (GLUCOPHAGE) 1000 MG tablet Take 1,000 mg by mouth 2 (two) times daily.  2    senna-docusate (SENOKOT-S) 8.6-50 MG tablet Take 1 tablet by mouth at bedtime.     vitamin B-12 (CYANOCOBALAMIN) 1000 MCG tablet Take 1,000 mcg by mouth every morning.     vitamin C (ASCORBIC ACID) 500 MG tablet Take 500 mg by mouth every morning.       Discharge Medications: Please see discharge summary for a list of discharge medications.  Relevant Imaging Results:  Relevant Lab Results:   Additional Information SSN: [id number]  Valentina Shaggy Brevon Dewald, LCSW

## 2021-09-01 NOTE — Progress Notes (Signed)
[facility] Called.   Alaysha Jefcoat M.Daeshaun Specht, MSW, LCSWA [facility]  Transitions of Care Clinical Social Worker I Direct Dial: [phone]  Fax: [phone] [email or web address]  

## 2021-09-01 NOTE — Evaluation (Signed)
Physical Therapy Evaluation Patient Details Name: Sydney Horton MRN: [id number] DOB: April 15, 1941 Today's Date: 09/01/2021   History of Present Illness  Cameshia Cressman is a 80 y.o. female w/ hx of dementia, HTN, diabetes,  presenting to ED  08/31/21 with concern for weakness and per  daughter SI. At baseline ambulates with RW, lives with daughter. Patient stated " I had spinal meningitis when I was 7". Noted RLE atrophy vs LLE.  Clinical Impression  Patient pleasant ,oriented to self and hospital and reports having  spinal meningitis when 80 YO.( Not documented in History) but RLE noted with atrophy and plantar flexed contracture. Patient indicates that she wears a brace. No family present to confirm information.  Patient incontinent of B/B, assisted with rolling/mobility to get cleaned up. Patient sat up with max assistance, noted listing to R. Did not attempt standing due to feet not touching floor from gurney height.  Patient will benefit from post acute rehab/SNF to return to ambulatory baseline.  Pt admitted with above diagnosis.  Pt currently with functional limitations due to the deficits listed below (see PT Problem List). Pt will benefit from skilled PT to increase their independence and safety with mobility to allow discharge to the venue listed below.       Follow Up Recommendations SNF    Equipment Recommendations  None recommended by PT    Recommendations for Other Services       Precautions / Restrictions Precautions Precautions: Fall Precaution Comments: incoontinent B/B, used depends Required Braces or Orthoses: Other Brace Other Brace: Pt. reports having a brace for right foot due to H/O spinal meningitis ( no family present to confirm)      Mobility  Bed Mobility Overal bed mobility: Needs Assistance Bed Mobility: Rolling;Sidelying to Sit;Sit to Sidelying Rolling: Min guard Sidelying to sit: Mod assist     Sit to sidelying: Max assist General bed mobility  comments: assist with  legs over bed edge, raising trunk to sitting, legs back onto bed to return to sidelying.    Transfers                 General transfer comment: NT, decreased sitting balance, feet not touching floor and  some paralysis RLE( previous History)  Ambulation/Gait             General Gait Details: TBA  Stairs            Wheelchair Mobility    Modified Rankin (Stroke Patients Only)       Balance Overall balance assessment: Needs assistance Sitting-balance support: Bilateral upper extremity supported;Feet supported Sitting balance-Leahy Scale: Poor Sitting balance - Comments: cues to shift weight to center. Postural control: Right lateral lean                                   Pertinent Vitals/Pain Pain Assessment: Faces Faces Pain Scale: Hurts a little bit Pain Location: during pericare. Pain Descriptors / Indicators: Discomfort;Moaning Pain Intervention(s): Monitored during session    Home Living Family/patient expects to be discharged to:: Private residence Living Arrangements: Children Available Help at Discharge: Family;Personal care attendant Type of Home: House Home Access: Stairs to enter   [employer] of Steps: 10 per patient,? reliable   Home Equipment: Walker - 2 wheels Additional Comments: unsure of home set up  , H/O demntia, no family present.    Prior Function  Comments: ambulates with RW,  has HH caregiver several hours /week per chart.     Hand Dominance   Dominant Hand: Right    Extremity/Trunk Assessment   Upper Extremity Assessment Upper Extremity Assessment: Overall WFL for tasks assessed    Lower Extremity Assessment Lower Extremity Assessment: RLE deficits/detail;LLE deficits/detail RLE Deficits / Details: ankle with planter flexion contracture, atrophy entire leg, able t raise from bed-3/5 hip flex and knee ext. trace dorsiflex, reports sensation intact to  LT LLE Deficits / Details: grossly WFL LLE Sensation: WNL LLE Coordination: WNL    Cervical / Trunk Assessment Cervical / Trunk Assessment: Normal  Communication   Communication: No difficulties  Cognition Arousal/Alertness: Awake/alert Behavior During Therapy: WFL for tasks assessed/performed Overall Cognitive Status: Impaired/Different from baseline Area of Impairment: Orientation                 Orientation Level: Time;Situation             General Comments: oriented to hospital , self, lives with dtr.asking for her glasses      General Comments      Exercises     Assessment/Plan    PT Assessment Patient needs continued PT services  PT Problem List Decreased strength;Decreased mobility;Decreased safety awareness;Decreased range of motion;Decreased knowledge of precautions;Decreased activity tolerance;Decreased cognition;Decreased balance       PT Treatment Interventions DME instruction;Therapeutic activities;Cognitive remediation;Gait training;Therapeutic exercise;Patient/family education;Balance training;Functional mobility training    PT Goals (Current goals can be found in the Care Plan section)  Acute Rehab PT Goals Patient Stated Goal: I want my glasses PT Goal Formulation: Patient unable to participate in goal setting Time For Goal Achievement: 09/15/21 Potential to Achieve Goals: Fair    Frequency Min 2X/week   Barriers to discharge        Co-evaluation               AM-PAC PT "6 Clicks" Mobility  Outcome Measure Help needed turning from your back to your side while in a flat bed without using bedrails?: A Little Help needed moving from lying on your back to sitting on the side of a flat bed without using bedrails?: A Lot Help needed moving to and from a bed to a chair (including a wheelchair)?: Total Help needed standing up from a chair using your arms (e.g., wheelchair or bedside chair)?: Total Help needed to walk in hospital  room?: Total Help needed climbing 3-5 steps with a railing? : Total 6 Click Score: 9    End of Session   Activity Tolerance: Patient tolerated treatment well Patient left: in bed;with call bell/phone within reach Nurse Communication: Mobility status (had BM) PT Visit Diagnosis: Unsteadiness on feet (R26.81);Difficulty in walking, not elsewhere classified (R26.2);Other symptoms and signs involving the nervous system (R29.898)    Time: 4782-9562 PT Time Calculation (min) (ACUTE ONLY): 40 min   Charges:   PT Evaluation $PT Eval Low Complexity: 1 Low PT Treatments $Therapeutic Activity: 8-22 mins $Self Care/Home Management: 8-22        Blanchard Kelch PT Acute Rehabilitation Services Pager [phone] Office [phone]   Rada Hay 09/01/2021, 9:06 AM

## 2021-09-01 NOTE — ED Notes (Signed)
Pt in bed watching tv and eating lunch. Takes meds and eats with no issues.

## 2021-09-01 NOTE — Progress Notes (Signed)
.  Transition of Care [facility]) - Emergency Department Mini Assessment   Patient Details  Name: Sydney Horton MRN: [id number] Date of Birth: 07-28-1941  Transition of Care [facility]) CM/SW Contact:    Larrie Kass, LCSW Phone Number: 09/01/2021, 10:27 AM   Clinical Narrative:  Pt is recommended for SNF placement. CSW contact [facility] and spoke with Ashok Cordia, she stated she will submit for auth to the team and call CSW back with approval decision.   ED Mini Assessment: What brought you to the Emergency Department? : weakness  Barriers to Discharge: SNF Pending bed offer             Patient Contact and Communications        ,          Patient states their goals for this hospitalization and ongoing recovery are:: Get stronger soon CMS Medicare.gov Compare Post Acute Care list provided to:: Patient Represenative (must comment) (daughter) Choice offered to / list presented to : Adult Children  Admission diagnosis:  weakness There are no problems to display for this patient.  PCP:  Jethro Bastos, MD Pharmacy:   [facility] [place], [place] - [address] [place] [address] Phone: [phone] Fax: [phone]

## 2021-09-01 NOTE — ED Notes (Signed)
Daughter called and asked for update on her mom and asked if she could come by after work today. Writer updated daughter and told her it was fine to come by after she got off work.

## 2021-09-01 NOTE — Progress Notes (Signed)
CSW confirmed with [facility] Admission Coordinator for [facility], Pt was offered a bed. CSW informed pt and pt's daughter of bed offer and both accepted. Pt can be d/c today to [facility]. CSW informed MD and RN of this. Call report to [facility] [phone].  Valentina Shaggy.Vivi Piccirilli, MSW, LCSWA [facility]  Transitions of Care Clinical Social Worker I Direct Dial: [phone]  Fax: [phone] [email or web address]

## 2021-09-01 NOTE — ED Notes (Signed)
Daughter to come by before pt goes to facility

## 2021-09-01 NOTE — ED Notes (Signed)
Pt in bed, clean sheets and brief applied.

## 2021-09-02 NOTE — ED Notes (Signed)
Report called to [facility]. 

## 2021-09-03 LAB — URINE CULTURE: Culture: >=100000 — AB

## 2021-09-04 ENCOUNTER — Telehealth: Payer: Self-pay

## 2021-09-04 NOTE — Telephone Encounter (Signed)
Post ED Visit - Positive Culture Follow-up: Successful Patient Follow-Up  Culture assessed and recommendations reviewed by:  [x]  , Student Pharm.D. []  Johnston Ebbs, Pharm.D., BCPS AQ-ID []  , Pharm.D., BCPS []  Celedonio Miyamoto, Pharm.D., BCPS []  [place], Garvin Fila.D., BCPS, AAHIVP []  , Pharm.D., BCPS, AAHIVP []  Georgina Pillion, PharmD, BCPS []  , PharmD, BCPS []  [place], PharmD, BCPS []  [address], PharmD  Positive urine culture  [x]  Patient discharged without antimicrobial prescription and treatment is now indicated []  Organism is resistant to prescribed ED discharge antimicrobial []  Patient with positive blood cultures  Changes discussed with ED provider: , DO New antibiotic prescription Keflex 500 mg po BID x 5 days if pt is having symptoms.  Faxed to [facility], date 09/04/2021, time 1:30pm   Lysle Pearl 09/04/2021, 1:35 PM

## 2021-09-04 NOTE — Progress Notes (Addendum)
ED Antimicrobial Stewardship Positive Culture Follow Up   Sydney Horton is an 81 y.o. female who presented to [facility] on 08/31/2021 with a chief complaint of weakness.   Chief Complaint  Patient presents with   Weakness    Recent Results (from the past 720 hour(s))  Urine Culture     Status: Abnormal   Collection Time: 08/31/21 12:23 PM   Specimen: Urine, Clean Catch  Result Value Ref Range Status   Specimen Description   Final    URINE, CLEAN CATCH Performed at [facility], [address], [place], [place] [address]    Special Requests   Final    NONE Performed at [facility], [address], [place], [place] [address]    Culture >=100,000 COLONIES/mL ESCHERICHIA COLI (A)  Final   Report Status 09/03/2021 FINAL  Final   Organism ID, Bacteria ESCHERICHIA COLI (A)  Final      Susceptibility   Escherichia coli - MIC*    AMPICILLIN <=2 SENSITIVE Sensitive     CEFAZOLIN <=4 SENSITIVE Sensitive     CEFEPIME <=0.12 SENSITIVE Sensitive     CEFTRIAXONE <=0.25 SENSITIVE Sensitive     CIPROFLOXACIN >=4 RESISTANT Resistant     GENTAMICIN <=1 SENSITIVE Sensitive     IMIPENEM <=0.25 SENSITIVE Sensitive     NITROFURANTOIN <=16 SENSITIVE Sensitive     TRIMETH/SULFA <=20 SENSITIVE Sensitive     AMPICILLIN/SULBACTAM <=2 SENSITIVE Sensitive     PIP/TAZO <=4 SENSITIVE Sensitive     * >=100,000 COLONIES/mL ESCHERICHIA COLI  Resp Panel by RT-PCR (Flu A&B, Covid) Nasopharyngeal Swab     Status: None   Collection Time: 08/31/21 12:37 PM   Specimen: Nasopharyngeal Swab; Nasopharyngeal(NP) swabs in vial transport medium  Result Value Ref Range Status   SARS Coronavirus 2 by RT PCR NEGATIVE NEGATIVE Final    Comment: (NOTE) SARS-CoV-2 target nucleic acids are NOT DETECTED.  The SARS-CoV-2 RNA is generally detectable in upper respiratory specimens during the acute phase of infection. The lowest concentration of SARS-CoV-2 viral copies this assay can  detect is 138 copies/mL. A negative result does not preclude SARS-Cov-2 infection and should not be used as the sole basis for treatment or other patient management decisions. A negative result may occur with  improper specimen collection/handling, submission of specimen other than nasopharyngeal swab, presence of viral mutation(s) within the areas targeted by this assay, and inadequate number of viral copies(<138 copies/mL). A negative result must be combined with clinical observations, patient history, and epidemiological information. The expected result is Negative.  Fact Sheet for Patients:  BloggerCourse.com  Fact Sheet for Healthcare Providers:  SeriousBroker.it  This test is no t yet approved or cleared by the [place] FDA and  has been authorized for detection and/or diagnosis of SARS-CoV-2 by FDA under an Emergency Use Authorization (EUA). This EUA will remain  in effect (meaning this test can be used) for the duration of the COVID-19 declaration under Section 564(b)(1) of the Act, 21 U.S.C.section 360bbb-3(b)(1), unless the authorization is terminated  or revoked sooner.       Influenza A by PCR NEGATIVE NEGATIVE Final   Influenza B by PCR NEGATIVE NEGATIVE Final    Comment: (NOTE) The Xpert Xpress SARS-CoV-2/FLU/RSV plus assay is intended as an aid in the diagnosis of influenza from Nasopharyngeal swab specimens and should not be used as a sole basis for treatment. Nasal washings and aspirates are unacceptable for Xpert Xpress SARS-CoV-2/FLU/RSV testing.  Fact Sheet for Patients:  BloggerCourse.com  Fact Sheet for Healthcare Providers: SeriousBroker.it  This test is not yet approved or cleared by the [place] FDA and has been authorized for detection and/or diagnosis of SARS-CoV-2 by FDA under an Emergency Use Authorization (EUA). This EUA will remain in  effect (meaning this test can be used) for the duration of the COVID-19 declaration under Section 564(b)(1) of the Act, 21 U.S.C. section 360bbb-3(b)(1), unless the authorization is terminated or revoked.  Performed at [facility], [address] [place], [place] [address]    80 YO F reported to the ED with complaints of weakness and having difficulty walking x 2 days. Patient denies fever or urinary symptoms. UA showed rare bacteria and UC grew > 100,000 colonies of E.coli.   Plan:  - Dr. Wallace Cullens advised to perform a symptom check on patient  - if patient is well - no treatment necessary at this time  - if patient is unwell - to prescribe Keflex  If needed as above, begin antibiotic prescription: Keflex 500 mg PO BID x 5 days    ED Provider: Edwin Dada, DO   Johnston Ebbs, Student Pharmacist

## 2021-10-09 ENCOUNTER — Other Ambulatory Visit: Payer: Self-pay | Admitting: Vascular Surgery

## 2021-10-09 ENCOUNTER — Ambulatory Visit
Admission: RE | Admit: 2021-10-09 | Discharge: 2021-10-09 | Disposition: A | Discharge status: Home / Self Care | Payer: Medicare (Managed Care) | Source: Ambulatory Visit | Attending: Vascular Surgery | Admitting: Vascular Surgery

## 2021-10-09 ENCOUNTER — Ambulatory Visit: Payer: Medicare (Managed Care)

## 2021-10-09 ENCOUNTER — Other Ambulatory Visit: Payer: Self-pay

## 2021-10-09 DIAGNOSIS — M25572 Pain in left ankle and joints of left foot: Secondary | ICD-10-CM

## 2021-10-09 DIAGNOSIS — M25562 Pain in left knee: Secondary | ICD-10-CM

## 2021-10-13 ENCOUNTER — Telehealth: Payer: Self-pay

## 2021-10-13 NOTE — Telephone Encounter (Signed)
[place] NP, with [facility] would like for Erin to take a look at patient's x-rays of her left knee and ankle.  Cb# [phone].  Please advise.  Thank you.

## 2021-10-13 NOTE — Telephone Encounter (Signed)
Please see below.

## 2022-01-13 ENCOUNTER — Emergency Department (HOSPITAL_COMMUNITY): Payer: Medicare (Managed Care)

## 2022-01-13 ENCOUNTER — Emergency Department (HOSPITAL_COMMUNITY)
Admission: EM | Admit: 2022-01-13 | Discharge: 2022-01-13 | Disposition: A | Discharge status: Home / Self Care | Payer: Medicare (Managed Care) | Attending: Emergency Medicine | Admitting: Emergency Medicine

## 2022-01-13 ENCOUNTER — Encounter (HOSPITAL_COMMUNITY): Payer: Self-pay | Admitting: Oncology

## 2022-01-13 ENCOUNTER — Other Ambulatory Visit: Payer: Self-pay

## 2022-01-13 DIAGNOSIS — R739 Hyperglycemia, unspecified: Secondary | ICD-10-CM | POA: Insufficient documentation

## 2022-01-13 DIAGNOSIS — Y92000 Kitchen of unspecified non-institutional (private) residence as  the place of occurrence of the external cause: Secondary | ICD-10-CM | POA: Diagnosis not present

## 2022-01-13 DIAGNOSIS — W1830XA Fall on same level, unspecified, initial encounter: Secondary | ICD-10-CM | POA: Diagnosis not present

## 2022-01-13 DIAGNOSIS — Z7984 Long term (current) use of oral hypoglycemic drugs: Secondary | ICD-10-CM | POA: Insufficient documentation

## 2022-01-13 DIAGNOSIS — S0990XA Unspecified injury of head, initial encounter: Secondary | ICD-10-CM | POA: Insufficient documentation

## 2022-01-13 DIAGNOSIS — Z7982 Long term (current) use of aspirin: Secondary | ICD-10-CM | POA: Diagnosis not present

## 2022-01-13 LAB — CBG MONITORING, ED: Glucose-Capillary: 274 mg/dL — ABNORMAL HIGH (ref 70–99)

## 2022-01-13 MED ORDER — LIDOCAINE HCL (PF) 1 % IJ SOLN
5.0000 mL | Freq: Once | INTRAMUSCULAR | Status: AC
  Administered 2022-01-13: 5 mL
  Filled 2022-01-13: qty 30

## 2022-01-13 NOTE — ED Notes (Signed)
Xylocane at bedside.

## 2022-01-13 NOTE — ED Triage Notes (Signed)
Pt bib PTAR from home s/p fall.  Pt fell from standing position striking the back of her head causing a laceration. PTAR reports that pt's CBG 448, states family reports Doctor d/c'd her insulin.  Pt confused at baseline.

## 2022-01-13 NOTE — ED Provider Notes (Signed)
Sydney Horton Provider Note   CSN: [id number] Arrival date & time: 01/13/22  5916     History  Chief Complaint  Patient presents with   Sydney Horton is a 81 y.o. female.  Patient presents ER chief complaint of mechanical fall.  Daughter was present during the fall states that she was in the kitchen lost her balance and fell backwards.      Home Medications Prior to Admission medications   Medication Sig Start Date End Date Taking? Authorizing Provider  amLODipine-benazepril (LOTREL) 5-20 MG capsule Take 1 capsule by mouth every morning.    [provider]  aspirin EC 81 MG tablet Take 81 mg by mouth every morning.    [provider]  atorvastatin (LIPITOR) 40 MG tablet Take 40 mg by mouth at bedtime.    [provider]  cholecalciferol (VITAMIN D) 25 MCG (1000 UNIT) tablet Take 1,000 Units by mouth every morning.    [provider]  donepezil (ARICEPT) 10 MG tablet Take 10 mg by mouth at bedtime.    [provider]  ELDERBERRY PO Take 2,000 mg by mouth every morning.    [provider]  empagliflozin (JARDIANCE) 10 MG TABS tablet Take 10 mg by mouth every morning.    [provider]  memantine (NAMENDA) 10 MG tablet Take 10 mg by mouth 2 (two) times daily.    [provider]  metFORMIN (GLUCOPHAGE) 1000 MG tablet Take 1,000 mg by mouth 2 (two) times daily. 03/19/18   [provider]  senna-docusate (SENOKOT-S) 8.6-50 MG tablet Take 1 tablet by mouth at bedtime.    [provider]  vitamin B-12 (CYANOCOBALAMIN) 1000 MCG tablet Take 1,000 mcg by mouth every morning.    [provider]  vitamin C (ASCORBIC ACID) 500 MG tablet Take 500 mg by mouth every morning.    [provider]      Allergies    Patient has no known allergies.    Review of Systems   Review of Systems  Physical Exam Updated Vital Signs BP 130/73 (BP  Location: Left Arm)    Pulse 91    Temp 97.9 F (36.6 C) (Oral)    Resp 18    SpO2 97%  Physical Exam  ED Results / Procedures / Treatments   Labs (all labs ordered are listed, but only abnormal results are displayed) Labs Reviewed  CBG MONITORING, ED - Abnormal; Notable for the following components:      Result Value   Glucose-Capillary 274 (*)    All other components within normal limits    EKG None  Radiology CT Head Wo Contrast  Result Date: 01/13/2022 CLINICAL DATA:  Fall, head trauma EXAM: CT HEAD WITHOUT CONTRAST TECHNIQUE: Contiguous axial images were obtained from the base of the skull through the vertex without intravenous contrast. RADIATION DOSE REDUCTION: This exam was performed according to the departmental dose-optimization program which includes automated exposure control, adjustment of the mA and/or kV according to patient size and/or use of iterative reconstruction technique. COMPARISON:  08/31/2021 FINDINGS: Brain: No evidence of acute infarction, hemorrhage, hydrocephalus, extra-axial collection or mass lesion/mass effect. Unchanged prominence of the lateral ventricles, likely related to global volume loss and ex vacuo dilatation. Periventricular and deep white matter hypodensity. Unchanged lacunar infarctions of the right basal ganglia (series 2, image 15). Vascular: No hyperdense vessel or unexpected calcification. Skull: Normal. Negative for fracture or focal lesion. Sinuses/Orbits: No acute finding. Other:  Soft tissue contusion of the right scalp vertex (series 5, image 54). IMPRESSION: 1. No acute intracranial pathology. 2. Prominence of the lateral ventricles unchanged and likely related to global volume loss and ex vacuo dilatation. 3. Small-vessel white matter disease and chronic lacunar infarctions of the right basal ganglia. 4. Soft tissue contusion of the right scalp vertex. Electronically Signed   By: Jearld Lesch M.D.   On: 01/13/2022 11:28     Procedures .Marland KitchenLaceration Repair  Date/Time: 01/13/2022 12:38 PM Performed by: Cheryll Cockayne, MD Authorized by: Cheryll Cockayne, MD   Comments:     Wound thoroughly irrigated with cleaning solution and gauze.  2 staples placed with lidocaine 1% epinephrine total of 1 cc instilled for anesthesia.  Good approximation of edges.  Dressing placed with Coban wrap.  Advised return in 10 days for staple removal.    Medications Ordered in ED Medications  lidocaine (PF) (XYLOCAINE) 1 % injection 5 mL (5 mLs Infiltration Given by Other 01/13/22 1150)    ED Course/ Medical Decision Making/ A&P                           Medical Decision Making Amount and/or Complexity of Data Reviewed Radiology: ordered.  Risk Prescription drug management.   Patient's blood sugar noted to be high.  Advised her to call her primary care doctor tomorrow as family reports that her doctors took her off of insulin.  Advised return if she has fevers vomiting difficulty breathing or any additional concerns return immediately to the ER.        Final Clinical Impression(s) / ED Diagnoses Final diagnoses:  Injury of head, initial encounter  Hyperglycemia    Rx / DC Orders ED Discharge Orders     None         Cheryll Cockayne, MD 01/13/22 1239

## 2022-01-13 NOTE — Discharge Instructions (Addendum)
Follow-up with your doctor tomorrow regarding your persistently high blood sugars.  Return in 10 days for staple removal.  Return immediately if your blood sugars continue to be high at home, if you have difficulty breathing palpitations or any additional concerns to return to [facility] back to the ER.

## 2022-01-22 ENCOUNTER — Emergency Department (HOSPITAL_COMMUNITY): Payer: Medicare (Managed Care)

## 2022-01-22 ENCOUNTER — Encounter (HOSPITAL_COMMUNITY): Payer: Self-pay

## 2022-01-22 ENCOUNTER — Emergency Department (HOSPITAL_COMMUNITY)
Admission: EM | Admit: 2022-01-22 | Discharge: 2022-01-23 | Disposition: A | Discharge status: Home / Self Care | Payer: Medicare (Managed Care) | Attending: Emergency Medicine | Admitting: Emergency Medicine

## 2022-01-22 DIAGNOSIS — Y990 Civilian activity done for income or pay: Secondary | ICD-10-CM | POA: Insufficient documentation

## 2022-01-22 DIAGNOSIS — F0390 Unspecified dementia without behavioral disturbance: Secondary | ICD-10-CM | POA: Insufficient documentation

## 2022-01-22 DIAGNOSIS — Z7982 Long term (current) use of aspirin: Secondary | ICD-10-CM | POA: Insufficient documentation

## 2022-01-22 DIAGNOSIS — Z79899 Other long term (current) drug therapy: Secondary | ICD-10-CM | POA: Diagnosis not present

## 2022-01-22 DIAGNOSIS — Z20822 Contact with and (suspected) exposure to covid-19: Secondary | ICD-10-CM | POA: Diagnosis not present

## 2022-01-22 DIAGNOSIS — N3000 Acute cystitis without hematuria: Secondary | ICD-10-CM | POA: Diagnosis not present

## 2022-01-22 DIAGNOSIS — W1839XA Other fall on same level, initial encounter: Secondary | ICD-10-CM | POA: Insufficient documentation

## 2022-01-22 DIAGNOSIS — I10 Essential (primary) hypertension: Secondary | ICD-10-CM | POA: Diagnosis not present

## 2022-01-22 DIAGNOSIS — Z7984 Long term (current) use of oral hypoglycemic drugs: Secondary | ICD-10-CM | POA: Insufficient documentation

## 2022-01-22 DIAGNOSIS — E119 Type 2 diabetes mellitus without complications: Secondary | ICD-10-CM | POA: Diagnosis not present

## 2022-01-22 DIAGNOSIS — M79671 Pain in right foot: Secondary | ICD-10-CM | POA: Diagnosis present

## 2022-01-22 DIAGNOSIS — W19XXXA Unspecified fall, initial encounter: Secondary | ICD-10-CM

## 2022-01-22 LAB — RESP PANEL BY RT-PCR (FLU A&B, COVID) ARPGX2
Influenza A by PCR: NEGATIVE
Influenza B by PCR: NEGATIVE
SARS Coronavirus 2 by RT PCR: NEGATIVE

## 2022-01-22 NOTE — ED Provider Notes (Signed)
[facility] EMERGENCY DEPARTMENT Provider Note   CSN: [id number] Arrival date & time: 01/22/22  1636     History  Chief Complaint  Patient presents with   Sydney Horton is a 81 y.o. female.  HPI     81 year old female with a history of dementia, diabetes, hypertension, hyperlipidemia, stroke, presents from an adult daycare where she had a fall and landed on her right side.  Daughter helps her at home, goes to [facility] for adult daycare during the day.  She is working with PT there, walking. At home she typically sits in the wheelchair but is able to walk.  Was in a normal state of health today prior to daycare, no headache, nausea, vomiting, diarrhea, black or bloody stool, appetite change, chest pain or dyspnea  Attempted to contact [facility] but did not receive answer.    She fell today while at day care. She landed on her right side.  Per report she had a broken foot on XR.   Patient reports she fell, denies LOC, denies concern for headache, neck pain, numbness, weakness, pain in the extremities (but on exam reports pain in extremities)   She has not walked or been able to bear weight since fall. Sent for further evaluation.    Past Medical History:  Diagnosis Date   Blind right eye    Dementia ([place])    Diabetes mellitus without complication ([place])    High cholesterol    Hypertension    Stroke [facility])      Home Medications Prior to Admission medications   Medication Sig Start Date End Date Taking? Authorizing Provider  acetaminophen (TYLENOL) 325 MG tablet Take 2 tablets (650 mg total) by mouth every 6 (six) hours as needed. 01/23/22  Yes Mesner, Corene Cornea, MD  cephALEXin (KEFLEX) 500 MG capsule Take 1 capsule (500 mg total) by mouth 4 (four) times daily. 01/23/22  Yes Mesner, Corene Cornea, MD  amLODipine-benazepril (LOTREL) 5-20 MG capsule Take 1 capsule by mouth every morning.    [provider]  aspirin EC 81 MG tablet Take 81 mg by mouth every  morning.    [provider]  atorvastatin (LIPITOR) 40 MG tablet Take 40 mg by mouth at bedtime.    [provider]  cholecalciferol (VITAMIN D) 25 MCG (1000 UNIT) tablet Take 1,000 Units by mouth every morning.    [provider]  donepezil (ARICEPT) 10 MG tablet Take 10 mg by mouth at bedtime.    [provider]  ELDERBERRY PO Take 2,000 mg by mouth every morning.    [provider]  empagliflozin (JARDIANCE) 10 MG TABS tablet Take 10 mg by mouth every morning.    [provider]  memantine (NAMENDA) 10 MG tablet Take 10 mg by mouth 2 (two) times daily.    [provider]  metFORMIN (GLUCOPHAGE) 1000 MG tablet Take 1,000 mg by mouth 2 (two) times daily. 03/19/18   [provider]  senna-docusate (SENOKOT-S) 8.6-50 MG tablet Take 1 tablet by mouth at bedtime.    [provider]  vitamin B-12 (CYANOCOBALAMIN) 1000 MCG tablet Take 1,000 mcg by mouth every morning.    [provider]  vitamin C (ASCORBIC ACID) 500 MG tablet Take 500 mg by mouth every morning.    [provider]      Allergies    Patient has no known allergies.    Review of Systems   Review of Systems  Physical Exam Updated  Vital Signs BP 136/81 (BP Location: Right Arm)    Pulse 68    Temp 97.8 F (36.6 C) (Oral)    Resp 18    Ht 5\' 2"  (1.575 m)    Wt 70.3 kg    SpO2 99%    BMI 28.35 kg/m  Physical Exam Vitals and nursing note reviewed.  Constitutional:      General: She is not in acute distress.    Appearance: She is well-developed. She is not diaphoretic.  HENT:     Head: Normocephalic and atraumatic.  Eyes:     General: No visual field deficit.    Conjunctiva/sclera: Conjunctivae normal.  Cardiovascular:     Rate and Rhythm: Normal rate and regular rhythm.     Heart sounds: Normal heart sounds. No murmur heard.   No friction rub. No gallop.  Pulmonary:     Effort: Pulmonary effort is normal. No respiratory  distress.     Breath sounds: Normal breath sounds. No wheezing or rales.  Abdominal:     General: There is no distension.     Palpations: Abdomen is soft.     Tenderness: There is no abdominal tenderness. There is no guarding.  Musculoskeletal:        General: No tenderness.     Cervical back: Normal range of motion.     Comments: Tenderness right pelvis/hip, pain with ROM right lower leg, tenderness to foot on right  Skin:    General: Skin is warm and dry.     Findings: No erythema or rash.  Neurological:     Mental Status: She is alert.     GCS: GCS eye subscore is 4. GCS verbal subscore is 5. GCS motor subscore is 6.     Cranial Nerves: No cranial nerve deficit or dysarthria.     Sensory: Sensation is intact. No sensory deficit.     Motor: Motor function is intact. No weakness.     Coordination: Coordination is intact. Finger-Nose-Finger Test normal.     Comments: Oriented to self, location     ED Results / Procedures / Treatments   Labs (all labs ordered are listed, but only abnormal results are displayed) Labs Reviewed  URINE CULTURE - Abnormal; Notable for the following components:      Result Value   Culture   (*)    Value: >=100,000 COLONIES/mL ESCHERICHIA COLI 70,000 COLONIES/mL AEROCOCCUS VIRIDANS Standardized susceptibility testing for this organism is not available. SUSCEPTIBILITIES TO FOLLOW Performed at [facility], [place] [address], [place], [place] [address]    All other components within normal limits  CBC WITH DIFFERENTIAL/PLATELET - Abnormal; Notable for the following components:   WBC 13.3 (*)    Platelets 417 (*)    Lymphs Abs 4.3 (*)    All other components within normal limits  COMPREHENSIVE METABOLIC PANEL - Abnormal; Notable for the following components:   Glucose, Bld 117 (*)    All other components within normal limits  URINALYSIS, ROUTINE W REFLEX MICROSCOPIC - Abnormal; Notable for the following components:   APPearance HAZY (*)     Leukocytes,Ua LARGE (*)    Bacteria, UA RARE (*)    All other components within normal limits  RESP PANEL BY RT-PCR (FLU A&B, COVID) ARPGX2  TROPONIN I (HIGH SENSITIVITY)    EKG EKG Interpretation  Date/Time:  Friday January 22 2022 22:35:07 EST Ventricular Rate:  87 PR Interval:  160 QRS Duration: 68 QT Interval:  336 QTC Calculation: 404 R  Axis:   55 Text Interpretation: Normal sinus rhythm Normal ECG When compared with ECG of 31-Aug-2021 12:57, PREVIOUS ECG IS PRESENT Confirmed by Nanda Quinton [phone]) on 01/24/2022 10:59:06 AM  Radiology DG Chest 2 View  Result Date: 01/22/2022 CLINICAL DATA:  Altered mental status. EXAM: CHEST - 2 VIEW COMPARISON:  Chest x-ray 08/31/2021. FINDINGS: The heart size and mediastinal contours are within normal limits. Both lungs are clear. The visualized skeletal structures are unremarkable. IMPRESSION: No active cardiopulmonary disease. Electronically Signed   By: Ronney Asters M.D.   On: 01/22/2022 22:26   CT Head Wo Contrast  Result Date: 01/22/2022 CLINICAL DATA:  Altered level of consciousness, head trauma EXAM: CT HEAD WITHOUT CONTRAST TECHNIQUE: Contiguous axial images were obtained from the base of the skull through the vertex without intravenous contrast. RADIATION DOSE REDUCTION: This exam was performed according to the departmental dose-optimization program which includes automated exposure control, adjustment of the mA and/or kV according to patient size and/or use of iterative reconstruction technique. COMPARISON:  01/13/2022, 04/06/2018 FINDINGS: Brain: No acute infarct or hemorrhage. Stable hypodensities within the right basal ganglia consistent with dilated perivascular spaces or chronic lacunar infarcts. Stable cerebral atrophy with continued ex vacuo dilatation of the lateral ventricles. Stable indeterminate calcifications in the suprasellar region. Remaining midline structures are unremarkable. No acute extra-axial fluid collections. No  mass effect. Vascular: Stable vascular calcifications within the vertebrobasilar and internal carotid artery distributions. No hyperdense vessel. Skull: Stable scalp hematoma along the midline vertex. Negative for fracture or focal lesion. Sinuses/Orbits: No acute finding. Other: None. IMPRESSION: 1. Stable head CT, no acute intracranial process. Electronically Signed   By: Randa Ngo M.D.   On: 01/22/2022 20:07   CT Cervical Spine Wo Contrast  Result Date: 01/22/2022 CLINICAL DATA:  Neck trauma. EXAM: CT CERVICAL SPINE WITHOUT CONTRAST TECHNIQUE: Multidetector CT imaging of the cervical spine was performed without intravenous contrast. Multiplanar CT image reconstructions were also generated. RADIATION DOSE REDUCTION: This exam was performed according to the departmental dose-optimization program which includes automated exposure control, adjustment of the mA and/or kV according to patient size and/or use of iterative reconstruction technique. COMPARISON:  Cervical spine CT 04/06/2018. FINDINGS: Alignment: Normal. Skull base and vertebrae: No acute fracture. No primary bone lesion or focal pathologic process. Soft tissues and spinal canal: There is stable intervertebral disc space narrowing throughout the cervical spine with endplate osteophyte formation compatible with moderate degenerative change. Multilevel neural foraminal stenosis is stable. Multilevel mild to moderate central canal stenosis is also unchanged. Disc levels: No significant central canal or neural foraminal stenosis at any level. Upper chest: Negative. Other: None. IMPRESSION: No acute fracture or traumatic subluxation of the cervical spine. Electronically Signed   By: Ronney Asters M.D.   On: 01/22/2022 20:02   CT Hip Right Wo Contrast  Result Date: 01/22/2022 CLINICAL DATA:  Fall, head trauma EXAM: CT OF THE RIGHT HIP WITHOUT CONTRAST TECHNIQUE: Multidetector CT imaging of the right hip was performed according to the standard  protocol. Multiplanar CT image reconstructions were also generated. RADIATION DOSE REDUCTION: This exam was performed according to the departmental dose-optimization program which includes automated exposure control, adjustment of the mA and/or kV according to patient size and/or use of iterative reconstruction technique. COMPARISON:  Right hip radiograph dated 01/22/2022 FINDINGS: Internal rotation of the right hip. No fracture is seen. Visualized bony pelvis appears intact. Right hip joint space is preserved. Visualized soft tissues are grossly unremarkable, noting vascular calcifications. IMPRESSION: No evidence of fracture  or dislocation. Electronically Signed   By: Julian Hy M.D.   On: 01/22/2022 22:38   CT FOOT RIGHT WO CONTRAST  Result Date: 01/23/2022 CLINICAL DATA:  Limping.  Foot pain. EXAM: CT OF THE RIGHT FOOT WITHOUT CONTRAST TECHNIQUE: Multidetector CT imaging of the right foot was performed according to the standard protocol. Multiplanar CT image reconstructions were also generated. RADIATION DOSE REDUCTION: This exam was performed according to the departmental dose-optimization program which includes automated exposure control, adjustment of the mA and/or kV according to patient size and/or use of iterative reconstruction technique. COMPARISON:  Right foot x-ray same day. FINDINGS: Bones/Joint/Cartilage The bones are diffusely osteopenic. There is no acute fracture or dislocation identified. There is degenerative narrowing of the joint spaces throughout the midfoot and at the tarsometatarsal joints. No focal osseous lesions are identified. There also mild degenerative changes of the ankle with joint space narrowing and osteophyte formation. Ligaments Suboptimally assessed by CT. Muscles and Tendons Negative. Soft tissues There is mild soft tissue swelling of the lateral and medial aspect of the ankle. There is also soft tissue swelling medial to the first metatarsophalangeal joint.  IMPRESSION: 1. No acute fracture or dislocation. 2. Soft tissue swelling at the first metatarsophalangeal joint and ankle. 3. Mild degenerative changes. 4. Diffuse osteopenia. Electronically Signed   By: Ronney Asters M.D.   On: 01/23/2022 00:20   DG Foot Complete Right  Result Date: 01/22/2022 CLINICAL DATA:  Fall. EXAM: RIGHT FOOT COMPLETE - 3+ VIEW COMPARISON:  None. FINDINGS: The bones are osteopenic. This limits evaluation for subtle nondisplaced fracture. There is no acute fracture or dislocation identified. There is mild hallux valgus. Joint spaces are grossly maintained. There is degenerative spurring of the dorsal midfoot. Soft tissues are within normal limits. IMPRESSION: 1. Limited by osteopenia. No definite acute fracture or dislocation. If there is high clinical concern for occult fracture, consider further evaluation with CT. Electronically Signed   By: Ronney Asters M.D.   On: 01/22/2022 21:13   DG Hip Unilat W or Wo Pelvis 2-3 Views Right  Result Date: 01/22/2022 CLINICAL DATA:  [place], landed on right side EXAM: DG HIP (WITH OR WITHOUT PELVIS) 2-3V RIGHT COMPARISON:  06/22/2021 FINDINGS: Frontal view of the pelvis as well as frontal and frogleg lateral views of the right hip are obtained. No acute fracture, subluxation, or dislocation. Joint spaces are well preserved. Remainder of the bony pelvis is unremarkable. Stable degenerative changes of the lower lumbar spine. IMPRESSION: 1. Stable exam, no acute fracture. Electronically Signed   By: Randa Ngo M.D.   On: 01/22/2022 20:26    Procedures Procedures    Medications Ordered in ED Medications  lactated ringers bolus 1,000 mL (0 mLs Intravenous Stopped 01/23/22 0542)  acetaminophen (TYLENOL) tablet 1,000 mg (1,000 mg Oral Given 01/23/22 0215)  cefTRIAXone (ROCEPHIN) 2 g in sodium chloride 0.9 % 100 mL IVPB (0 g Intravenous Stopped 01/23/22 [id number])    ED Course/ Medical Decision Making/ A&P     81 year old female with a history  of dementia, diabetes, hypertension, hyperlipidemia, stroke, presents from an adult daycare where she had a fall and landed on her right side.  CT head and CSpine completed given dementia hx and not clear if reliable historian, and evaluated by radiology and me and show no acute findings.  XR hip followed by right hip CT ordered given pain with movement, inability to bear weight on the right side shows no fracture.  Initially when they attempted to have her  stand she didn't appear to be having pain, but appeared to be generally weak. On my exam, she appears to be not wanting to bear weight on right side but does not have coordination to stand on left leg alone. Do not see signs of CVA on exam.  Has foot tenderness, will order foot CT to evaluate for possible occult fx.  Ordered labs to evaluate for etiology of possible generalized weakness or infection contributing to difficulty standing.  Neuro exam appears normal and doubt CVA. If labs without significant findings, suspect she may be discharged with PCP follow up, foot pain contributing to difficult standing. Signed out to Dr. Dayna Barker with labs and CT foot pending.          Final Clinical Impression(s) / ED Diagnoses Final diagnoses:  Fall, initial encounter  Acute cystitis without hematuria  Right foot pain    Rx / DC Orders ED Discharge Orders          [facility]        01/23/22 [phone]    Face-to-face encounter (required for Medicare/Medicaid patients)       Comments: I Merrily Pew certify that this patient is under my care and that I, or a nurse practitioner or physician's assistant working with me, had a face-to-face encounter that meets the physician face-to-face encounter requirements with this patient on 01/23/2022. The encounter with the patient was in whole, or in part for the following medical condition(s) which is the primary reason for home health care (List medical condition): foot pain making ambulation difficult  and falling more. Further orders per pcp.   01/23/22 0538    cephALEXin (KEFLEX) 500 MG capsule  4 times daily        01/23/22 0538    acetaminophen (TYLENOL) 325 MG tablet  Every 6 hours PRN        01/23/22 [id number]              Gareth Morgan, MD 01/24/22 1850

## 2022-01-22 NOTE — ED Provider Notes (Signed)
11:35 PM Assumed care from Dr. Dalene Seltzer, please see their note for full history, physical and decision making until this point. In brief this is a 81 y.o. year old female who presented to the ED tonight with Sydney Horton at [facility]. XR w/ fracture? Also with more frequent falls. Possibly foot pain. Pending labs for generalized weakness and CT foot.   Workup consistent with UTIand negative for fracture. Pain improving with tylenol. Per previous provider discussions w/ daughter, will d/c with PT/OT at home, repeat xr in a week if not improving. Abx started here, will continue at home. TOC and face to face orders placed.   Discharge instructions, including strict return precautions for new or worsening symptoms, given. Patient and/or family verbalized understanding and agreement with the plan as described.   Labs, studies and imaging reviewed by myself and considered in medical decision making if ordered. Imaging interpreted by radiology.  Labs Reviewed  RESP PANEL BY RT-PCR (FLU A&B, COVID) ARPGX2  URINE CULTURE  CBC WITH DIFFERENTIAL/PLATELET  COMPREHENSIVE METABOLIC PANEL  URINALYSIS, ROUTINE W REFLEX MICROSCOPIC  TROPONIN I (HIGH SENSITIVITY)  TROPONIN I (HIGH SENSITIVITY)    CT Hip Right Wo Contrast  Final Result    DG Chest 2 View  Final Result    DG Foot Complete Right  Final Result    DG Hip Unilat W or Wo Pelvis 2-3 Views Right  Final Result    CT Head Wo Contrast  Final Result    CT Cervical Spine Wo Contrast  Final Result    CT FOOT RIGHT W CONTRAST    (Results Pending)    No follow-ups on file.    Sydney Horton, Barbara Cower, MD 01/23/22 [phone]

## 2022-01-22 NOTE — ED Notes (Signed)
Per MD pt needed to stand. Tech attempted to stand pt. Pt is unable to stand after multiple attempts. Pt is assisted back into bed. MD is made aware.

## 2022-01-22 NOTE — ED Triage Notes (Signed)
Pt was at Adult Day Care when pt fell and landed to right side. Right foot, 4th toe fracture confirmed by xray done today. Pt denies any pain.

## 2022-01-23 ENCOUNTER — Encounter (HOSPITAL_COMMUNITY): Payer: Self-pay | Admitting: Emergency Medicine

## 2022-01-23 ENCOUNTER — Other Ambulatory Visit: Payer: Self-pay

## 2022-01-23 ENCOUNTER — Emergency Department (HOSPITAL_COMMUNITY): Payer: Medicare (Managed Care)

## 2022-01-23 LAB — CBC WITH DIFFERENTIAL/PLATELET
Abs Immature Granulocytes: 0.07 10*3/uL (ref 0.00–0.07)
Basophils Absolute: 0.1 10*3/uL (ref 0.0–0.1)
Basophils Relative: 1 %
Eosinophils Absolute: 0.2 10*3/uL (ref 0.0–0.5)
Eosinophils Relative: 1 %
HCT: 41.4 % (ref 36.0–46.0)
Hemoglobin: 13.9 g/dL (ref 12.0–15.0)
Immature Granulocytes: 1 %
Lymphocytes Relative: 32 %
Lymphs Abs: 4.3 10*3/uL — ABNORMAL HIGH (ref 0.7–4.0)
MCH: 30.9 pg (ref 26.0–34.0)
MCHC: 33.6 g/dL (ref 30.0–36.0)
MCV: 92 fL (ref 80.0–100.0)
Monocytes Absolute: 1 10*3/uL (ref 0.1–1.0)
Monocytes Relative: 7 %
Neutro Abs: 7.7 10*3/uL (ref 1.7–7.7)
Neutrophils Relative %: 58 %
Platelets: 417 10*3/uL — ABNORMAL HIGH (ref 150–400)
RBC: 4.5 MIL/uL (ref 3.87–5.11)
RDW: 12.8 % (ref 11.5–15.5)
WBC: 13.3 10*3/uL — ABNORMAL HIGH (ref 4.0–10.5)
nRBC: 0 % (ref 0.0–0.2)

## 2022-01-23 LAB — URINALYSIS, ROUTINE W REFLEX MICROSCOPIC
Bilirubin Urine: NEGATIVE
Glucose, UA: NEGATIVE mg/dL
Hgb urine dipstick: NEGATIVE
Ketones, ur: NEGATIVE mg/dL
Nitrite: NEGATIVE
Protein, ur: NEGATIVE mg/dL
Specific Gravity, Urine: 1.009 (ref 1.005–1.030)
pH: 8 (ref 5.0–8.0)

## 2022-01-23 LAB — COMPREHENSIVE METABOLIC PANEL
ALT: 21 U/L (ref 0–44)
AST: 20 U/L (ref 15–41)
Albumin: 3.7 g/dL (ref 3.5–5.0)
Alkaline Phosphatase: 77 U/L (ref 38–126)
Anion gap: 10 (ref 5–15)
BUN: 8 mg/dL (ref 8–23)
CO2: 24 mmol/L (ref 22–32)
Calcium: 9.7 mg/dL (ref 8.9–10.3)
Chloride: 107 mmol/L (ref 98–111)
Creatinine, Ser: 0.76 mg/dL (ref 0.44–1.00)
GFR, Estimated: >60 mL/min (ref >60)
Glucose, Bld: 117 mg/dL — ABNORMAL HIGH (ref 70–99)
Potassium: 3.7 mmol/L (ref 3.5–5.1)
Sodium: 141 mmol/L (ref 135–145)
Total Bilirubin: 1 mg/dL (ref 0.3–1.2)
Total Protein: 6.7 g/dL (ref 6.5–8.1)

## 2022-01-23 LAB — TROPONIN I (HIGH SENSITIVITY): Troponin I (High Sensitivity): 3 ng/L (ref <18)

## 2022-01-23 MED ORDER — ACETAMINOPHEN 500 MG PO TABS
1000.0000 mg | ORAL_TABLET | Freq: Once | ORAL | Status: AC
  Administered 2022-01-23: 1000 mg via ORAL
  Filled 2022-01-23: qty 2

## 2022-01-23 MED ORDER — LACTATED RINGERS IV BOLUS
1000.0000 mL | Freq: Once | INTRAVENOUS | Status: AC
  Administered 2022-01-23: 1000 mL via INTRAVENOUS

## 2022-01-23 MED ORDER — ACETAMINOPHEN 325 MG PO TABS: 650.0000 mg | ORAL_TABLET | Freq: Four times a day (QID) | ORAL | 0 refills | Status: AC | PRN

## 2022-01-23 MED ORDER — SODIUM CHLORIDE 0.9 % IV SOLN
2.0000 g | Freq: Once | INTRAVENOUS | Status: AC
  Administered 2022-01-23: 2 g via INTRAVENOUS
  Filled 2022-01-23: qty 20

## 2022-01-23 MED ORDER — CEPHALEXIN 500 MG PO CAPS: 500.0000 mg | ORAL_CAPSULE | Freq: Four times a day (QID) | ORAL | 0 refills | Status: AC

## 2022-01-23 NOTE — ED Notes (Signed)
Spoke with pt's daughter & she will be on her way to get pt soon.

## 2022-01-23 NOTE — Care Management (Addendum)
Patient here for falll, weakness.CT MD ordered PT OT HH. Ordered will call [facility] regarding this. Family member will pick up patient.. Faxed HH orders to [facility]. Called and left message with [facility] as well . Daughter made aware of recommendations and the information sent to [facility]

## 2022-01-23 NOTE — Care Management (Signed)
Home Health Routine, Clinic Performed Mesner, Barbara Cower, MD New at Discharge  Face-to-face encounter (required for Medicare/Medicaid patients) Routine, Clinic Performed, I Marily Memos certify that this patient is under my care and that I, or a nurse practitioner or physician's assistant working with me, had a face-to-face encounter that meets the physician face-to-face encounter requirements with this patient on 01/23/2022. The encounter with the patient was in whole, or in part for the following medical condition(s) which is the primary reason for home health care (List medical condition): foot pain making ambulation difficult and falling more. Further orders per pcp. The encounter with the patient was in whole, or in part, for the following medical condition, which is the primary reason for home health care: foot pain making ambulation difficult and falling more I certify that, based on my findings, the following services are medically necessary home health services: Physical therapy Reason for Medically Necessary Home Health Services: Therapy- Therapeutic Exercises to Increase Strength and Endurance My clinical findings support the need for the above services: Unsafe ambulation due to balance issues Further, I certify that my clinical findings support that this patient is homebound due to: Pain interferes with ambulation/mobility Mesner, Barbara Cower, MD New at Discharge

## 2022-01-25 LAB — URINE CULTURE: Culture: >=100000 — AB

## 2022-01-26 ENCOUNTER — Telehealth: Payer: Self-pay | Admitting: *Deleted

## 2022-01-26 NOTE — Telephone Encounter (Signed)
Post ED Visit - Positive Culture Follow-up  Culture report reviewed by antimicrobial stewardship pharmacist: [facility] []  , Pharm.D. []  Enzo Bi, Pharm.D., BCPS AQ-ID []  , Pharm.D., BCPS []  Celedonio Miyamoto, Pharm.D., BCPS []  [place], Garvin Fila.D., BCPS, AAHIVP []  , Pharm.D., BCPS, AAHIVP []  Georgina Pillion, PharmD, BCPS []  , PharmD, BCPS []  [place], PharmD, BCPS []  [address], PharmD []  , PharmD, BCPS []  Estella Husk, PharmD  Pharmacy Team []  Lysle Pearl, PharmD []  , PharmD []  Phillips Climes, PharmD []  , Rph []  Agapito Games) , PharmD []  Verlan Friends, PharmD []  , PharmD []  Mervyn Gay, PharmD []  , PharmD []  Vinnie Level, PharmD []  [facility], PharmD []  , PharmD []  Len Childs, PharmD   Positive urine culture Treated with Cephalexin, organism sensitive to the same and no further patient follow-up is required at this time.  , PharmD  Greer Pickerel Talley 01/26/2022, 12:15 PM

## 2022-06-25 ENCOUNTER — Encounter (INDEPENDENT_AMBULATORY_CARE_PROVIDER_SITE_OTHER): Payer: Medicare Other | Admitting: Ophthalmology

## 2022-07-12 ENCOUNTER — Encounter (INDEPENDENT_AMBULATORY_CARE_PROVIDER_SITE_OTHER): Payer: Medicare Other | Admitting: Ophthalmology

## 2022-08-16 ENCOUNTER — Encounter (INDEPENDENT_AMBULATORY_CARE_PROVIDER_SITE_OTHER): Payer: Medicare (Managed Care) | Admitting: Ophthalmology

## 2022-08-16 ENCOUNTER — Encounter (INDEPENDENT_AMBULATORY_CARE_PROVIDER_SITE_OTHER): Payer: Medicare Other | Admitting: Ophthalmology

## 2022-09-09 IMAGING — DX DG HIP (WITH OR WITHOUT PELVIS) 2-3V*R*
3 series · 3 of 3 positions shown · non-contrast
Comparison: 06/22/2021

CLINICAL DATA: Fell, landed on right side

EXAM:
DG HIP (WITH OR WITHOUT PELVIS) 2-3V RIGHT

[hip ap]
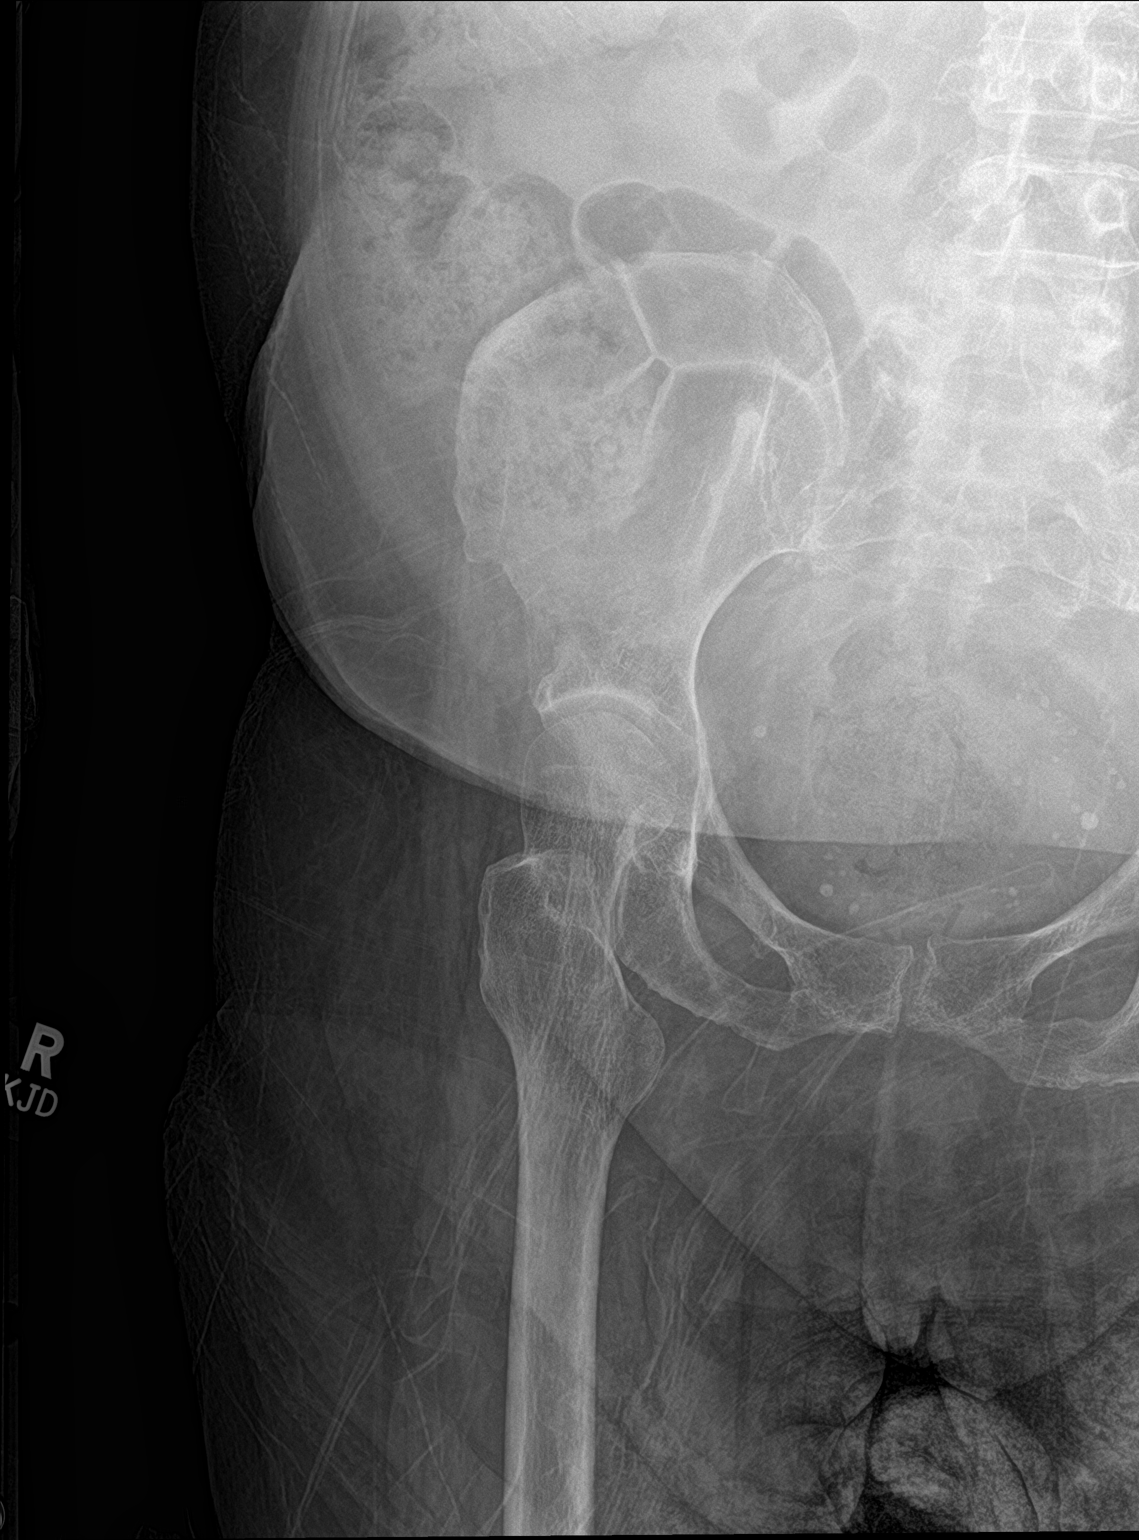

[hip lat]
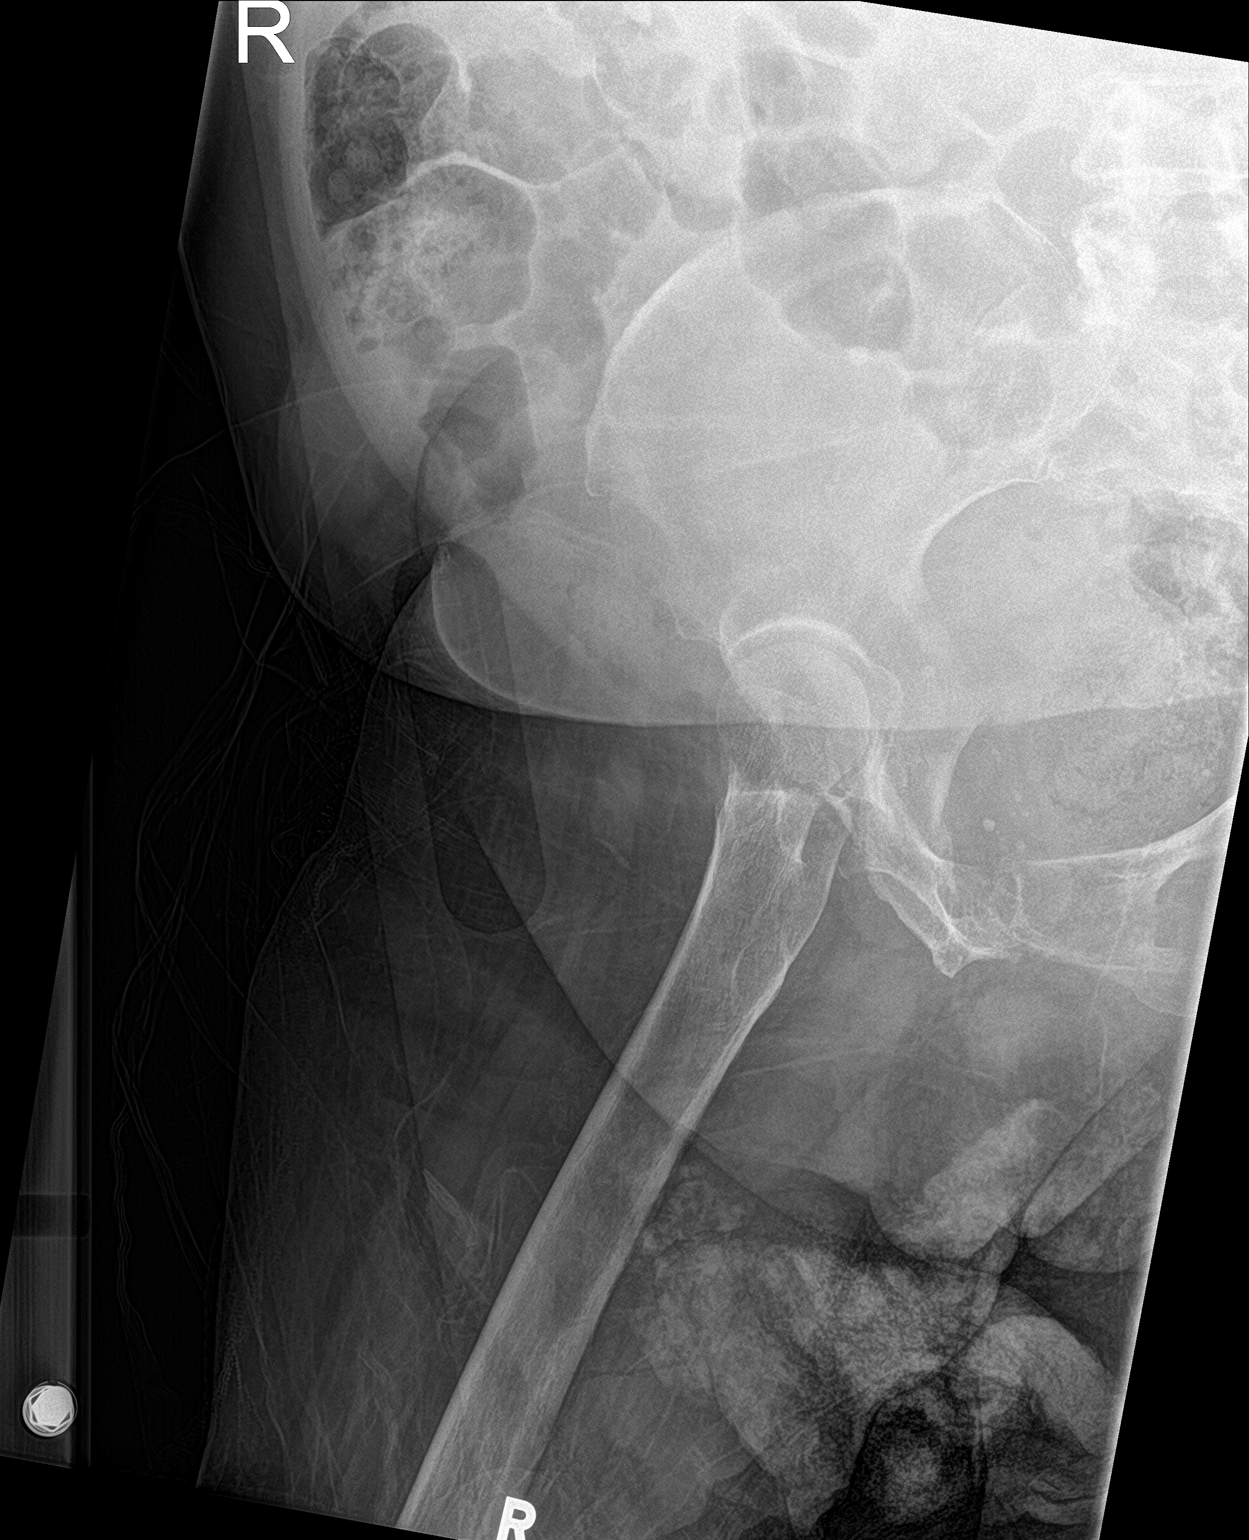

[pelvis ap]
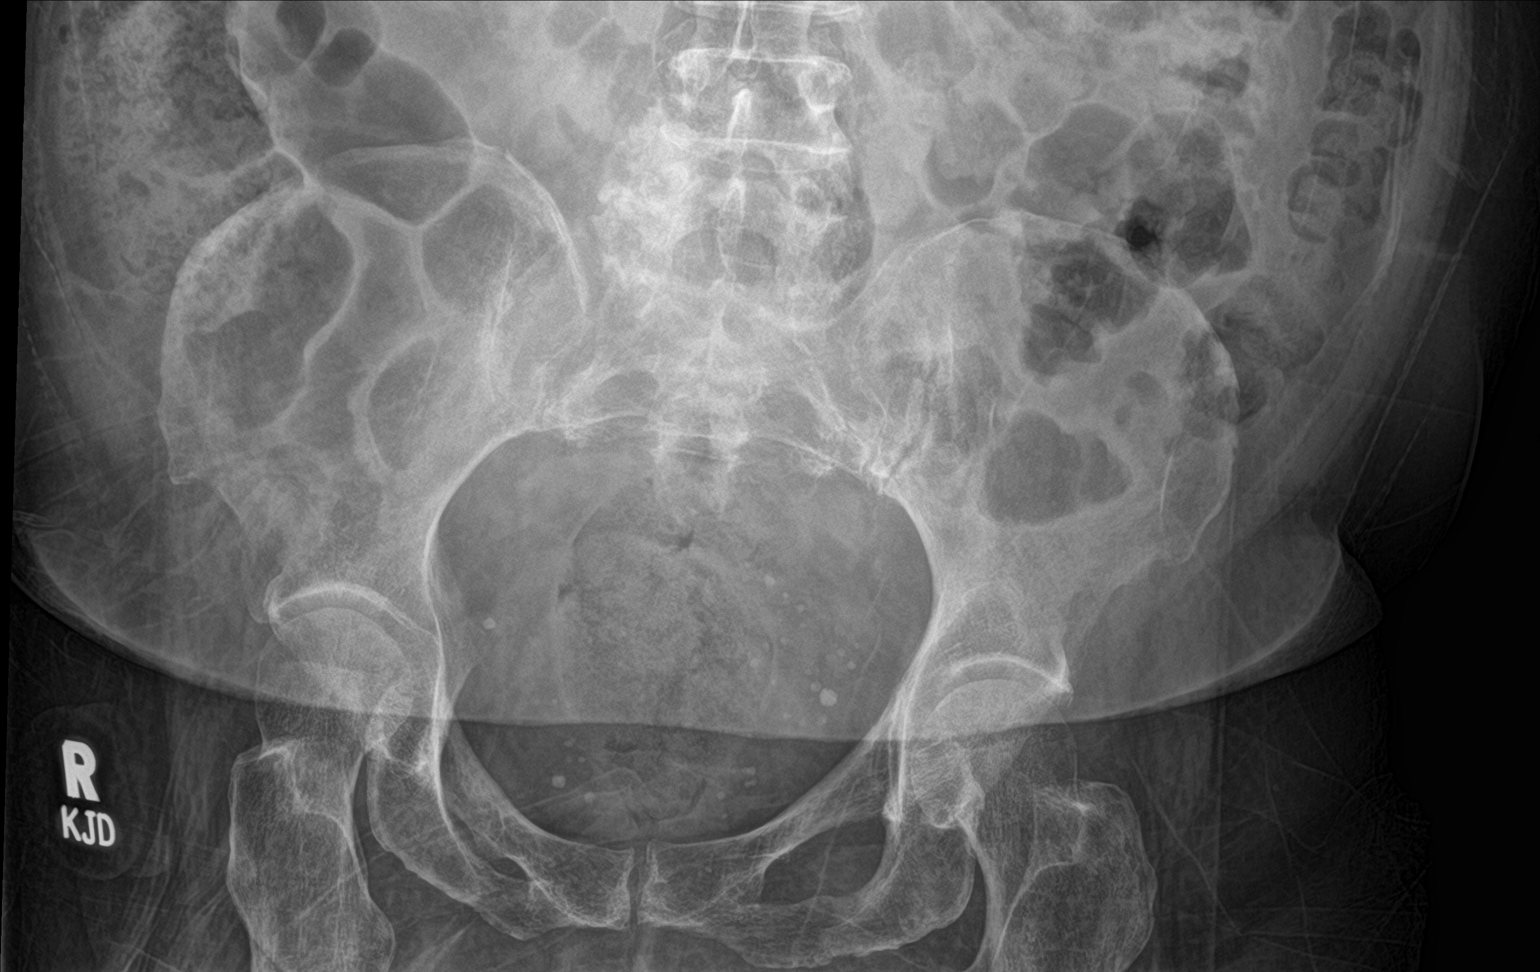

[3 of 3 positions shown; findings below may reference images not displayed]

FINDINGS: Frontal view of the pelvis as well as frontal and frogleg lateral
views of the right hip are obtained. No acute fracture, subluxation,
or dislocation. Joint spaces are well preserved. Remainder of the
bony pelvis is unremarkable. Stable degenerative changes of the
lower lumbar spine.
IMPRESSION: 1. Stable exam, no acute fracture.

## 2022-09-09 IMAGING — CR DG CHEST 2V
2 series · 2 of 2 positions shown · non-contrast
Comparison: Chest x-ray 08/31/2021.

CLINICAL DATA: Altered mental status.

EXAM:
CHEST - 2 VIEW

[chest lat]
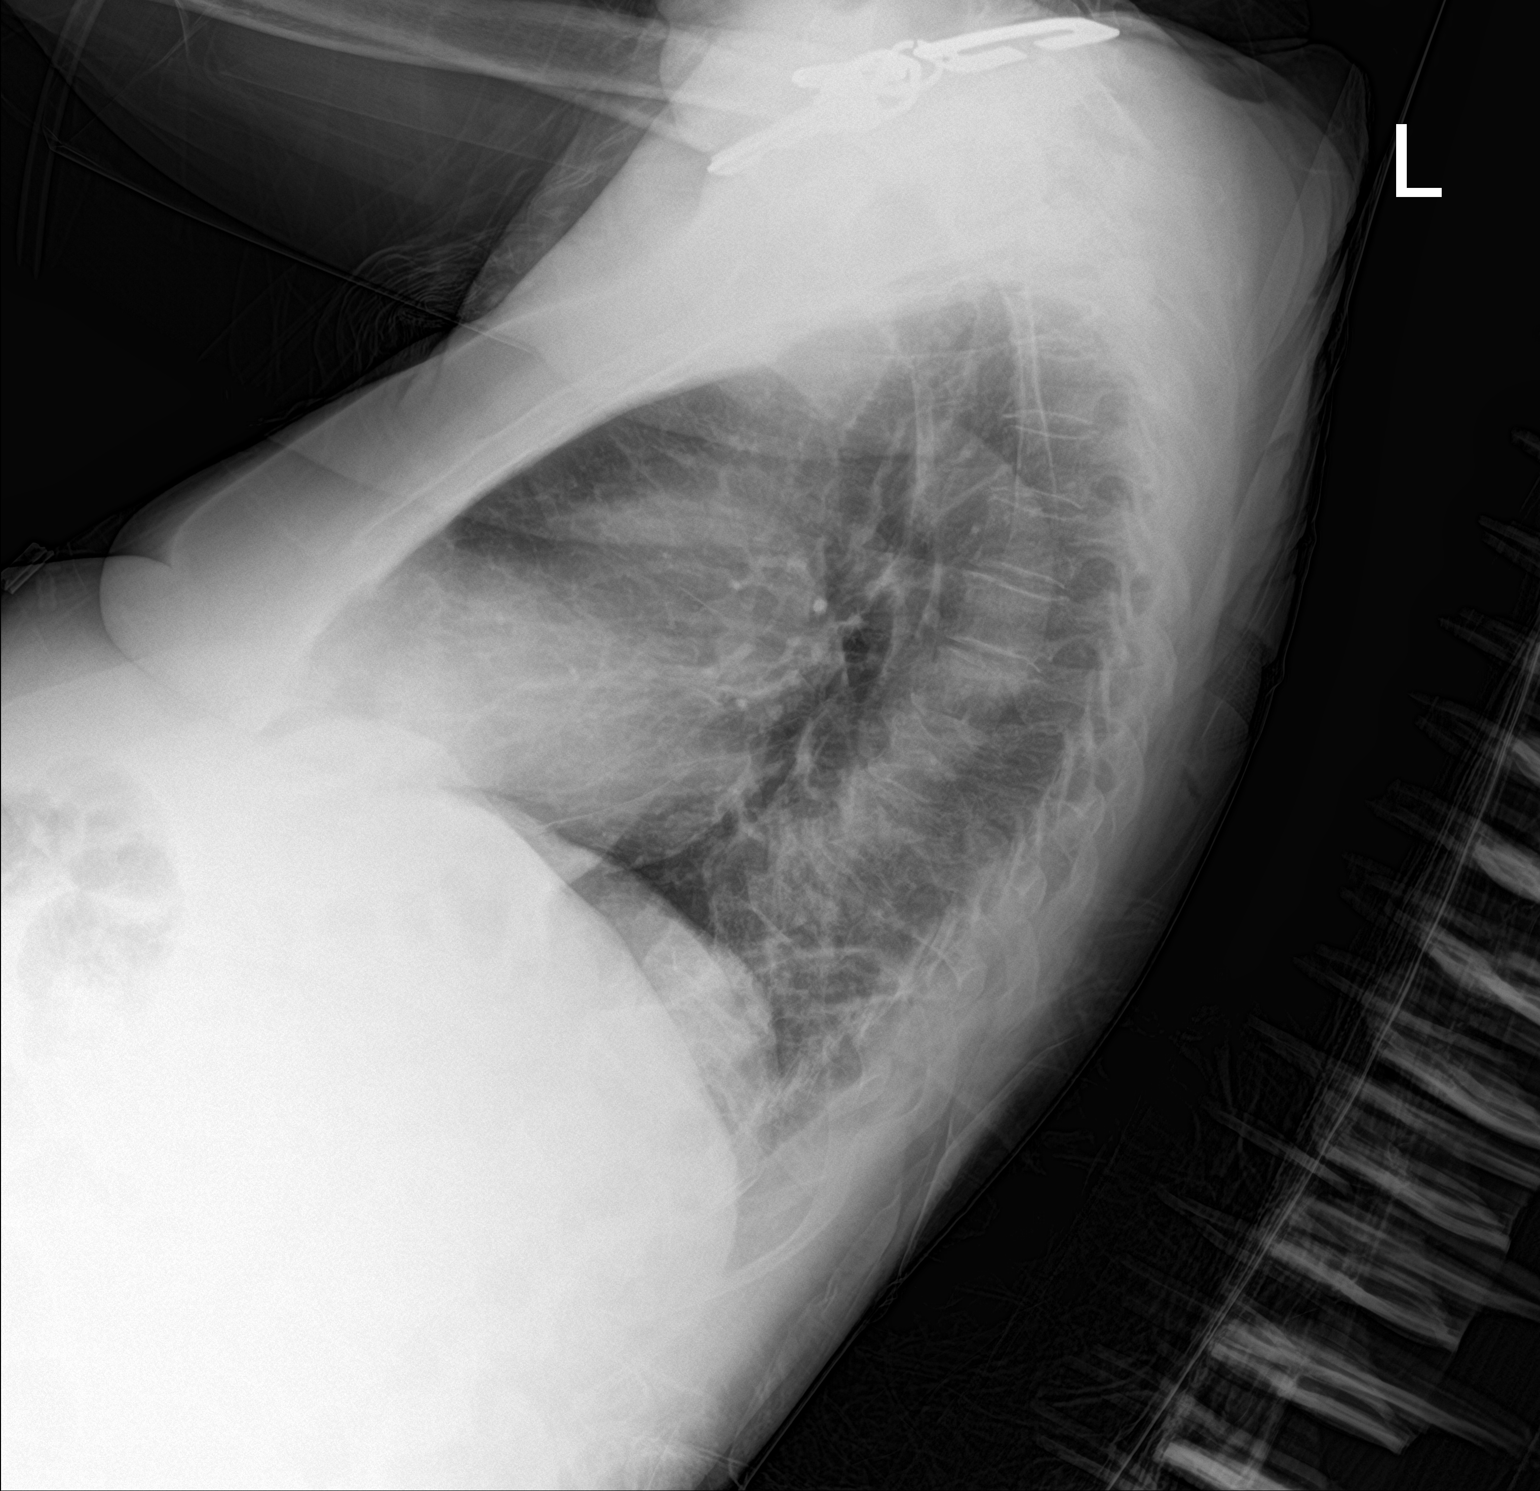

[chest ap]
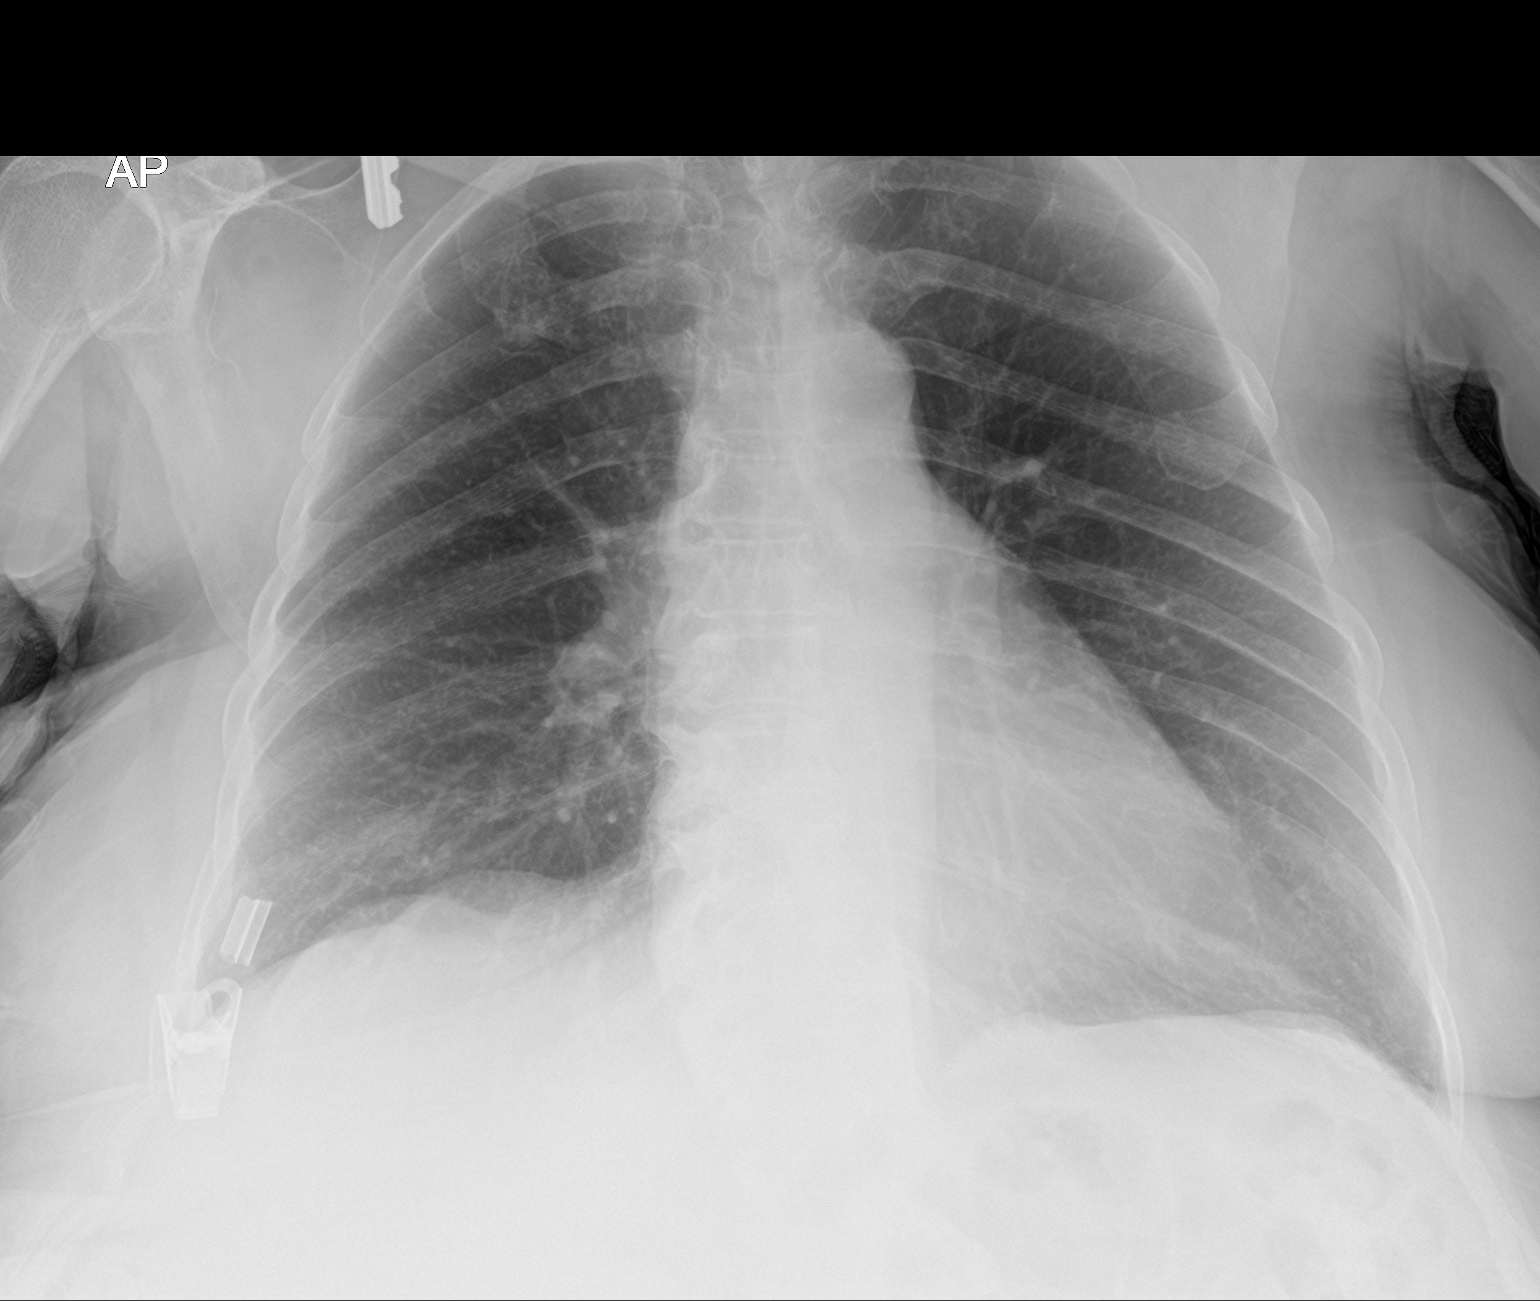

[2 of 2 positions shown; findings below may reference images not displayed]

FINDINGS: The heart size and mediastinal contours are within normal limits.
Both lungs are clear. The visualized skeletal structures are
unremarkable.
IMPRESSION: No active cardiopulmonary disease.

## 2022-09-10 IMAGING — CT CT FOOT*R* W/O CM
2 series · 14 of 30 positions shown, 16 images · non-contrast
Comparison: Right foot x-ray same day.

CLINICAL DATA: Limping.  Foot pain.

EXAM:
CT OF THE RIGHT FOOT WITHOUT CONTRAST
TECHNIQUE: Multidetector CT imaging of the right foot was performed according
to the standard protocol. Multiplanar CT image reconstructions were
also generated.
RADIATION DOSE REDUCTION: This exam was performed according to the
departmental dose-optimization program which includes automated
exposure control, adjustment of the mA and/or kV according to
patient size and/or use of iterative reconstruction technique.

[Series 4: rt foot soft tissue · axial · 0.37mm/px · z∈[+32,+222]mm · 8 of 113 slices shown, 10 images]
[im 9/113  soft-tissue]
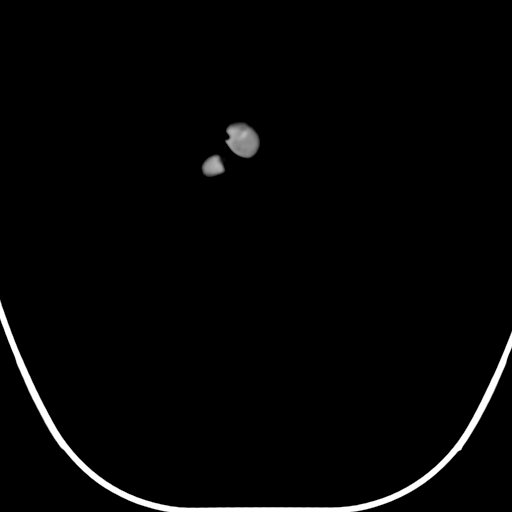
[im 9/113  bone]
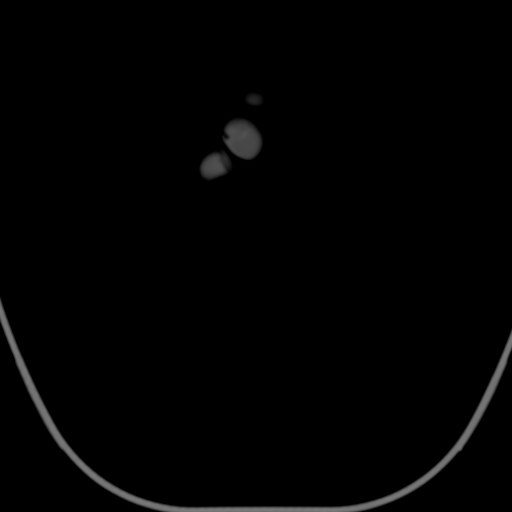
[im 26/113  bone]
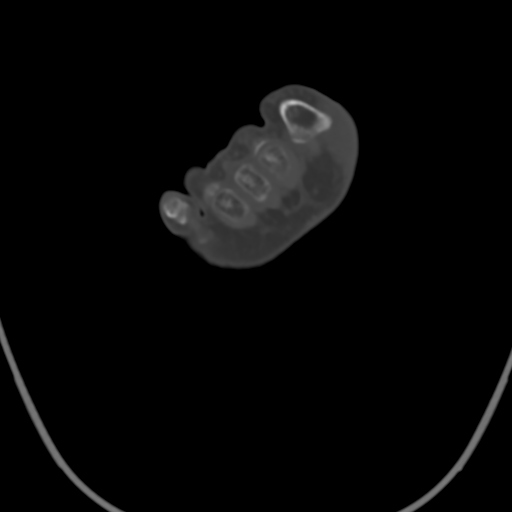
[im 35/113  bone]
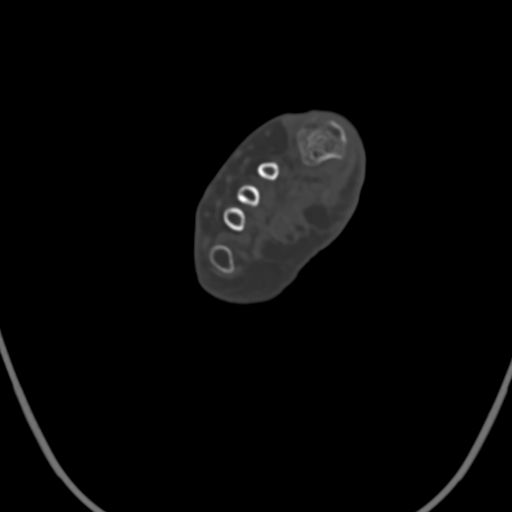
[im 52/113  bone]
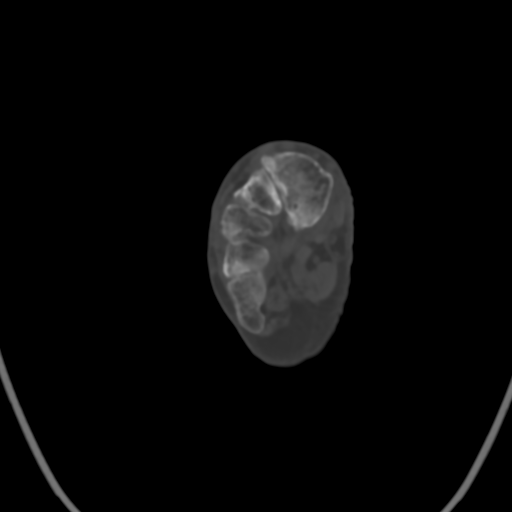
[im 61/113  soft-tissue]
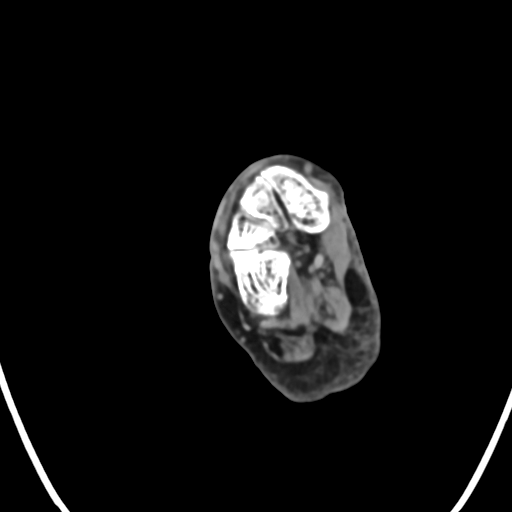
[im 61/113  bone]
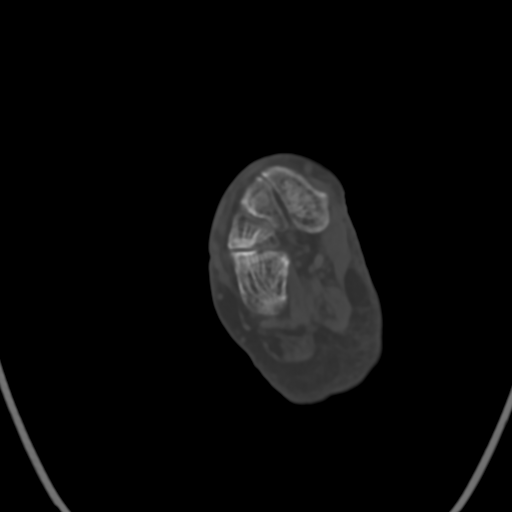
[im 78/113  bone]
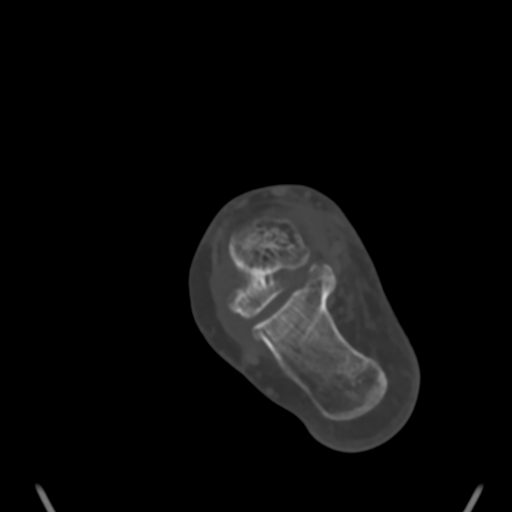
[im 87/113  bone]
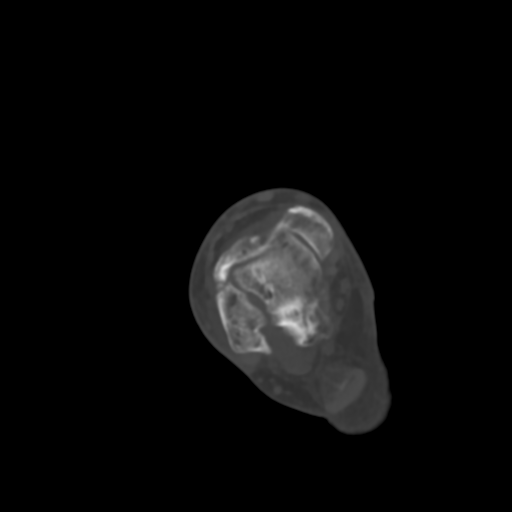
[im 104/113  bone]
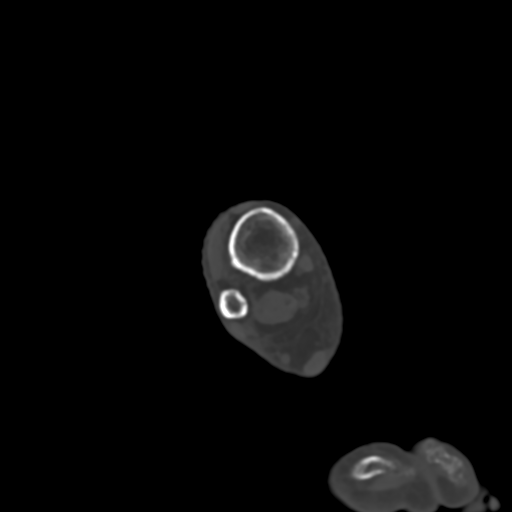

[Series 9: sag soft tissue · sagittal · 0.32mm/px · 6 of 52 slices shown]
[im 18/52  bone]
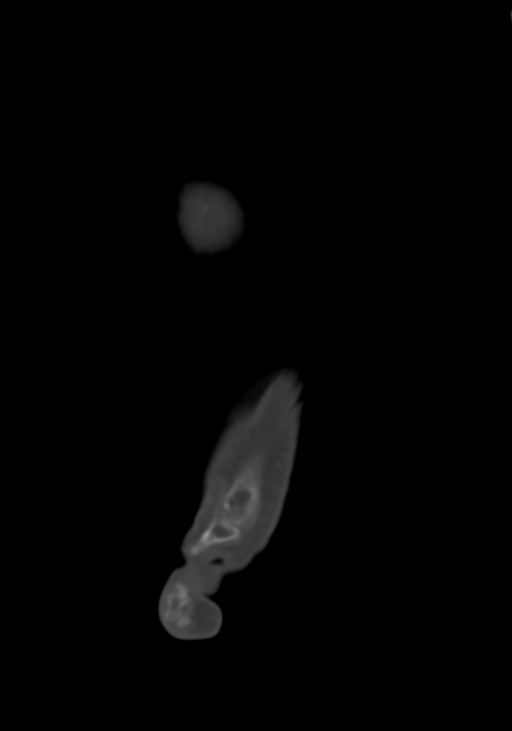
[im 22/52  bone]
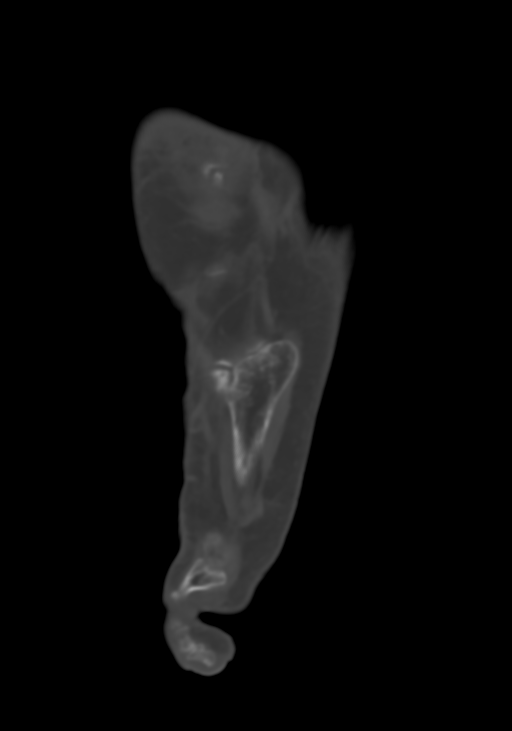
[im 25/52  soft-tissue]
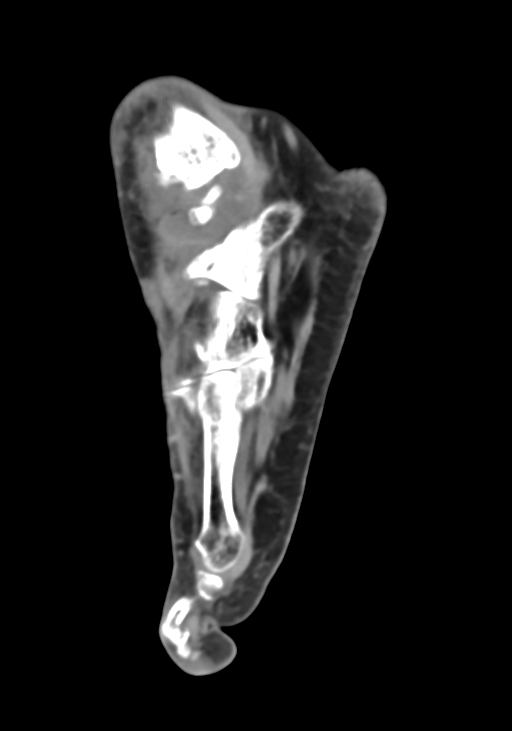
[im 26/52  bone]
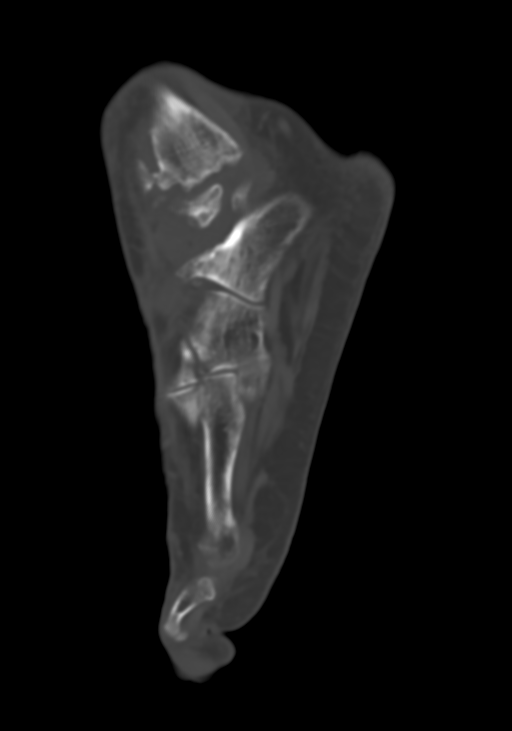
[im 30/52  bone]
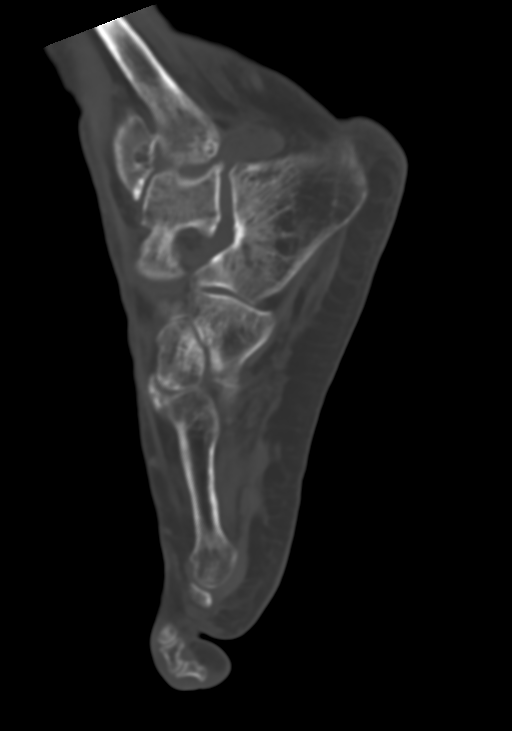
[im 35/52  bone]
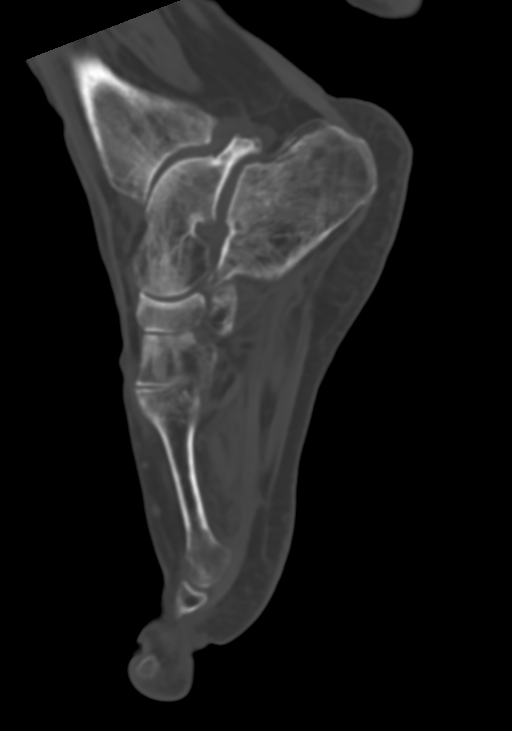

[14 of 30 positions shown; findings below may reference images not displayed]

FINDINGS: Bones/Joint/Cartilage

The bones are diffusely osteopenic. There is no acute fracture or
dislocation identified. There is degenerative narrowing of the joint
spaces throughout the midfoot and at the tarsometatarsal joints. No
focal osseous lesions are identified. There also mild degenerative
changes of the ankle with joint space narrowing and osteophyte
formation.

Ligaments

Suboptimally assessed by CT.

Muscles and Tendons

Negative.

Soft tissues

There is mild soft tissue swelling of the lateral and medial aspect
of the ankle. There is also soft tissue swelling medial to the first
metatarsophalangeal joint.
IMPRESSION: 1. No acute fracture or dislocation.
2. Soft tissue swelling at the first metatarsophalangeal joint and
ankle.
3. Mild degenerative changes.
4. Diffuse osteopenia.

## 2022-09-15 ENCOUNTER — Encounter (INDEPENDENT_AMBULATORY_CARE_PROVIDER_SITE_OTHER): Payer: Medicare (Managed Care) | Admitting: Ophthalmology

## 2022-09-15 DIAGNOSIS — H35032 Hypertensive retinopathy, left eye: Secondary | ICD-10-CM | POA: Diagnosis not present

## 2022-09-15 DIAGNOSIS — H43812 Vitreous degeneration, left eye: Secondary | ICD-10-CM | POA: Diagnosis not present

## 2022-09-15 DIAGNOSIS — I10 Essential (primary) hypertension: Secondary | ICD-10-CM | POA: Diagnosis not present

## 2023-08-08 ENCOUNTER — Encounter (INDEPENDENT_AMBULATORY_CARE_PROVIDER_SITE_OTHER): Payer: Medicare Other | Admitting: Ophthalmology

## 2023-09-15 ENCOUNTER — Encounter (INDEPENDENT_AMBULATORY_CARE_PROVIDER_SITE_OTHER): Payer: Medicare (Managed Care) | Admitting: Ophthalmology

## 2024-11-01 ENCOUNTER — Encounter (INDEPENDENT_AMBULATORY_CARE_PROVIDER_SITE_OTHER): Payer: Medicare (Managed Care) | Admitting: Ophthalmology

## 2024-11-01 DIAGNOSIS — I10 Essential (primary) hypertension: Secondary | ICD-10-CM

## 2024-11-01 DIAGNOSIS — H35032 Hypertensive retinopathy, left eye: Secondary | ICD-10-CM

## 2024-11-01 DIAGNOSIS — H43812 Vitreous degeneration, left eye: Secondary | ICD-10-CM

## 2025-11-01 ENCOUNTER — Encounter (INDEPENDENT_AMBULATORY_CARE_PROVIDER_SITE_OTHER): Admitting: Ophthalmology
